# Patient Record
Sex: Female | Born: 1985 | Race: White | Hispanic: No | Marital: Married | Smoking: Former smoker
Health system: Southern US, Community
[De-identification: ages and names within clinical notes are randomized; demographics above are authoritative.]

## PROBLEM LIST (undated history)

## (undated) DIAGNOSIS — F1110 Opioid abuse, uncomplicated: Secondary | ICD-10-CM

## (undated) DIAGNOSIS — Z98891 History of uterine scar from previous surgery: Secondary | ICD-10-CM

## (undated) DIAGNOSIS — Z973 Presence of spectacles and contact lenses: Secondary | ICD-10-CM

## (undated) DIAGNOSIS — Z302 Encounter for sterilization: Secondary | ICD-10-CM

## (undated) DIAGNOSIS — Z9889 Other specified postprocedural states: Secondary | ICD-10-CM

## (undated) DIAGNOSIS — Z8632 Personal history of gestational diabetes: Secondary | ICD-10-CM

## (undated) DIAGNOSIS — R87629 Unspecified abnormal cytological findings in specimens from vagina: Secondary | ICD-10-CM

## (undated) DIAGNOSIS — F1121 Opioid dependence, in remission: Secondary | ICD-10-CM

## (undated) DIAGNOSIS — O24419 Gestational diabetes mellitus in pregnancy, unspecified control: Secondary | ICD-10-CM

## (undated) DIAGNOSIS — Z87410 Personal history of cervical dysplasia: Secondary | ICD-10-CM

## (undated) DIAGNOSIS — E559 Vitamin D deficiency, unspecified: Secondary | ICD-10-CM

## (undated) DIAGNOSIS — R112 Nausea with vomiting, unspecified: Secondary | ICD-10-CM

## (undated) DIAGNOSIS — Z8489 Family history of other specified conditions: Secondary | ICD-10-CM

## (undated) HISTORY — PX: LEEP: SHX91

## (undated) HISTORY — PX: CHOLECYSTECTOMY: SHX55

---
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
## 2003-02-14 ENCOUNTER — Other Ambulatory Visit: Admission: RE | Admit: 2003-02-14 | Discharge: 2003-02-14 | Payer: Self-pay | Admitting: Gynecology

## 2003-06-18 ENCOUNTER — Inpatient Hospital Stay (HOSPITAL_COMMUNITY): Admission: AD | Admit: 2003-06-18 | Discharge: 2003-06-18 | Payer: Self-pay | Admitting: Gynecology

## 2003-08-01 ENCOUNTER — Inpatient Hospital Stay (HOSPITAL_COMMUNITY): Admission: AD | Admit: 2003-08-01 | Discharge: 2003-08-01 | Payer: Self-pay | Admitting: *Deleted

## 2003-08-10 ENCOUNTER — Encounter: Admission: RE | Admit: 2003-08-10 | Discharge: 2003-08-10 | Payer: Self-pay | Admitting: Family Medicine

## 2003-08-17 ENCOUNTER — Ambulatory Visit (HOSPITAL_COMMUNITY): Admission: RE | Admit: 2003-08-17 | Discharge: 2003-08-17 | Payer: Self-pay | Admitting: Family Medicine

## 2005-03-04 ENCOUNTER — Other Ambulatory Visit: Admission: RE | Admit: 2005-03-04 | Discharge: 2005-03-04 | Payer: Self-pay | Admitting: Obstetrics and Gynecology

## 2005-06-25 ENCOUNTER — Other Ambulatory Visit: Admission: RE | Admit: 2005-06-25 | Discharge: 2005-06-25 | Payer: Self-pay | Admitting: Obstetrics and Gynecology

## 2005-07-15 ENCOUNTER — Other Ambulatory Visit: Admission: RE | Admit: 2005-07-15 | Discharge: 2005-07-15 | Payer: Self-pay | Admitting: Obstetrics and Gynecology

## 2005-09-24 ENCOUNTER — Other Ambulatory Visit: Admission: RE | Admit: 2005-09-24 | Discharge: 2005-09-24 | Payer: Self-pay | Admitting: Obstetrics & Gynecology

## 2006-02-18 ENCOUNTER — Other Ambulatory Visit: Admission: RE | Admit: 2006-02-18 | Discharge: 2006-02-18 | Payer: Self-pay | Admitting: Obstetrics & Gynecology

## 2006-09-24 ENCOUNTER — Inpatient Hospital Stay (HOSPITAL_COMMUNITY): Admission: RE | Admit: 2006-09-24 | Discharge: 2006-09-28 | Payer: Self-pay | Admitting: Obstetrics and Gynecology

## 2008-02-27 ENCOUNTER — Ambulatory Visit (HOSPITAL_COMMUNITY): Admission: RE | Admit: 2008-02-27 | Discharge: 2008-02-27 | Payer: Self-pay | Admitting: Family Medicine

## 2008-02-27 ENCOUNTER — Emergency Department (HOSPITAL_COMMUNITY): Admission: EM | Admit: 2008-02-27 | Discharge: 2008-02-28 | Payer: Self-pay | Admitting: Emergency Medicine

## 2008-02-28 ENCOUNTER — Encounter: Payer: Self-pay | Admitting: Gastroenterology

## 2008-03-02 DIAGNOSIS — R935 Abnormal findings on diagnostic imaging of other abdominal regions, including retroperitoneum: Secondary | ICD-10-CM | POA: Insufficient documentation

## 2008-03-02 DIAGNOSIS — R188 Other ascites: Secondary | ICD-10-CM | POA: Insufficient documentation

## 2008-03-02 DIAGNOSIS — R109 Unspecified abdominal pain: Secondary | ICD-10-CM | POA: Insufficient documentation

## 2008-03-07 ENCOUNTER — Ambulatory Visit: Payer: Self-pay | Admitting: Gastroenterology

## 2008-03-07 ENCOUNTER — Telehealth: Payer: Self-pay | Admitting: Gastroenterology

## 2008-03-07 DIAGNOSIS — K219 Gastro-esophageal reflux disease without esophagitis: Secondary | ICD-10-CM | POA: Insufficient documentation

## 2008-03-08 ENCOUNTER — Ambulatory Visit: Payer: Self-pay | Admitting: Gastroenterology

## 2008-12-13 ENCOUNTER — Emergency Department (HOSPITAL_COMMUNITY): Admission: EM | Admit: 2008-12-13 | Discharge: 2008-12-13 | Payer: Self-pay | Admitting: Emergency Medicine

## 2008-12-13 ENCOUNTER — Emergency Department (HOSPITAL_COMMUNITY): Admission: EM | Admit: 2008-12-13 | Discharge: 2008-12-13 | Payer: Self-pay | Admitting: Family Medicine

## 2009-09-13 ENCOUNTER — Encounter: Admission: RE | Admit: 2009-09-13 | Discharge: 2009-09-13 | Payer: Self-pay | Admitting: Internal Medicine

## 2010-03-12 ENCOUNTER — Emergency Department (HOSPITAL_COMMUNITY): Admission: EM | Admit: 2010-03-12 | Discharge: 2010-03-12 | Payer: Self-pay | Admitting: Family Medicine

## 2010-08-19 ENCOUNTER — Inpatient Hospital Stay (INDEPENDENT_AMBULATORY_CARE_PROVIDER_SITE_OTHER)
Admission: RE | Admit: 2010-08-19 | Discharge: 2010-08-19 | Disposition: A | Discharge status: Home / Self Care | Payer: Self-pay | Source: Ambulatory Visit | Attending: Family Medicine | Admitting: Family Medicine

## 2010-08-19 DIAGNOSIS — K5289 Other specified noninfective gastroenteritis and colitis: Secondary | ICD-10-CM

## 2010-08-19 LAB — POCT URINALYSIS DIP (DEVICE)
Bilirubin Urine: NEGATIVE
Glucose, UA: NEGATIVE mg/dL
Hgb urine dipstick: NEGATIVE
Ketones, ur: NEGATIVE mg/dL
Specific Gravity, Urine: 1.02 (ref 1.005–1.030)
pH: 5.5 (ref 5.0–8.0)

## 2010-10-11 NOTE — Op Note (Signed)
NAMEMARIPOSA, [name]              ACCOUNT NO.:  [id number]   MEDICAL RECORD NO.:  [id number]          PATIENT TYPE:  INP   LOCATION:  9129                          FACILITY:  [facility]   PHYSICIAN:  Sherron Monday, MD        DATE OF BIRTH:  11/24/1985   DATE OF PROCEDURE:  09/25/2006  DATE OF DISCHARGE:                               OPERATIVE REPORT   PREOPERATIVE DIAGNOSES:  1. Intrauterine pregnancy at term.  2. Failure to progress.  3. Arrest of dilatation.   POSTOPERATIVE DIAGNOSES:  1. Intrauterine pregnancy at term.  2. Failure to progress.  3. Arrest of dilatation.  4. Delivered via LTCS.   PROCEDURE:  Primary low transverse cesarean section.   ANESTHESIA:  Epidural.   SURGEON:  Sherron Monday, MD.   COMPLICATIONS:  None.   PATHOLOGY:  None.   FINDINGS:  Viable female infant at 6:37 a.m., Apgar of 9 at 1 minute and  9 at 5 minutes, weight 7 pounds 9 ounces. Placenta expressed in its  entirety. Normal uterus, tubes and ovaries.   ESTIMATED BLOOD LOSS:  500 mL.   IV FLUIDS:  2600 mL.   URINE OUTPUT:  400 mL clear.   DISPOSITION:  Stable to the PACU.   DESCRIPTION OF PROCEDURE:  After informed consent was reviewed with the  patient including the risks, benefits, and alternatives of the surgical  procedure, she was transported to the OR. Her epidural was dosed and  found to be adequate. She was then prepped and draped in the normal  sterile fashion. A Pfannenstiel skin incision was made approximately 2  fingerbreadths above the pubic symphysis and carried through the  underlying layer of fascia sharply, the fascia was incised in the  midline, the incision was extended laterally with Mayo scissors. The  inferior aspect of the fascial incision was grasped with Kocher clamps,  elevated and the rectus muscle dissected off bluntly and sharply.  Attention was then turned to the superior portion of the fascial  incision which was done in a similar fashion and was elevated  with  Kocher clamps. The rectus muscles were dissected off this bluntly and  sharply. The midline was easily identified, the peritoneum was entered  bluntly. The incision was extended superiorly and inferiorly with good  visualization of the bladder. The Alexis C section retractor was placed  and tracked to make sure that there was no bowel entrapped. The  vesicouterine peritoneum was identified, tented up with some pickups and  the bladder flap was created digitally and with Metzenbaum scissors.  A  transverse uterine incision was made and the infant was delivered from  the vertex presentation atraumatically. The nose and mouth were  suctioned and the fetal cord was clamped and cut. The infant was handed  off to the waiting pediatric staff. The placenta was expressed from the  uterus. The uterus was cleared of all clots and debris. The uterine  incision was closed with #0 Monocryl in a running locked fashion. A  second layer of this same suture was used as an imbricating stitch.  Hemostasis was assured,  copious irrigation was performed. The peritoneum  was reapproximated with several figure-of-eights of 2-0 Vicryl. The  fascia was closed with #0 Vicryl. The subcuticular adipose layer was  made hemostatic with bovie cautery. A 2-0 plain gut was used to close  the subcutaneous tissue. The skin was closed with staples. The patient  tolerated the procedure well. Sponge, lap and needle counts were correct  x2 at the end of the procedure.      Sherron Monday, MD  Electronically Signed     JB/MEDQ  D:  09/25/2006  T:  09/25/2006  Job:  [id number]

## 2010-10-11 NOTE — Group Therapy Note (Signed)
[name], [name]                          ACCOUNT NO.:  [id number]   MEDICAL RECORD NO.:  [id number]                   PATIENT TYPE:  OUT   LOCATION:  [facility]                           FACILITY:  [facility]   PHYSICIAN:  Tinnie Gens, MD                     DATE OF BIRTH:  10-Aug-1985   DATE OF SERVICE:  08/10/2003                                    CLINIC NOTE   CHIEF COMPLAINT:  Pelvic pain.   HISTORY OF PRESENT ILLNESS:  The patient is a 25 year old G0 who apparently  had an extensive workup by Dr. Nicholas Lose who had a ruptured cyst, but otherwise  a negative workup.  However, she did have abnormal Pap and history of LEEP  some time previously although she cannot remember exactly how long it is  been.  It has been a long time since she had a Pap smear for follow-up.  She  had also been on OCs which she can no longer afford.  She also lost her  insurance which is why she is here.  She was seen in the MAU on August 01, 2003 with increasing pelvic pain.  She reports that the pain is worse when  she is not bleeding but once her period starts it seems to get better.  She  did have regular cycles but for the last 2 months have been abnormal.   The patient reports the pain is in the left lower quadrant, noted it is  sharp in nature, is worse with movement.  It is also worse when she has gas  or is constipated.  She does report that Dr. Nicholas Lose told her this could be  IBS; however, she thinks she is fairly regular, hence that is likely not the  issue.  She denies any blood in her stool or blood in her urine.  She did  have the CT when she was seen in the MAU on August 01, 2003 that showed no  renal calculi and an essentially normal pelvis.  She had a pelvic ultrasound  in January 2005 that showed a 2 x 2 cm simple cyst on the left ovary with a  small amount of free fluid.   PAST MEDICAL HISTORY:  Significant for pyelonephritis in the past.   SURGICAL HISTORY:  Negative.   MEDICATIONS:   Anaprox.   ALLERGIES:  None known.   OBSTETRICAL HISTORY:  G0.   GYNECOLOGICAL HISTORY:  Menarche at age 48.  Regular cycles, last 3-4 days.  She is using condoms for contraception.  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  No STD history.   FAMILY HISTORY:  Diabetes in a grandmother.   SOCIAL HISTORY:  She is a smoker of one pack per day for the last 6 years.  She works as a Child psychotherapist.   REVIEW OF SYSTEMS:  A 14-point review of systems reviewed.  It is diffusely  positive, specifically for bruising, numbness and weakness in the legs,  muscle aches, fevers, night sweats, fatigue, dizzy spells, nausea, vomiting,  and trouble with urination at times; then the abdominal pain as described in  the HPI.   PHYSICAL EXAMINATION:  VITAL SIGNS:  Temperature 97.8, pulse 64, blood  pressure 105/52, weight 116.  GENERAL:  She is a well-developed well-nourished white female in no acute  distress.  ABDOMEN:  She does have some feeling of fullness in the left lower quadrant  that is tender with palpation.  She has negative CVA tenderness.  GENITOURINARY:  She has normal external female genitalia.  The cervix is  visualized and has an old scar from a LEEP.  The uterus is anteverted and  nontender.  The adnexa are without tenderness or mass.   IMPRESSION:  1. Abdominal pain, questionable etiology.  Have explained to the patient     that it is my goal to rule out pelvic pathology.  2. Abnormal Pap smear.  3. Smoker.  4. History of left ovarian cyst.   PLAN:  1. Pelvic ultrasound.  2. Pap smear today.  3. Continue with Anaprox.  4. Consider trial of OCs although the patient has been on these in the past     and they did not seem to help.  Will bring this up at her next visit.                                               Tinnie Gens, MD    TP/MEDQ  D:  08/10/2003  T:  08/11/2003  Job:  [id number]

## 2010-10-11 NOTE — Discharge Summary (Signed)
NAMEAUBURN, [name]              ACCOUNT NO.:  [id number]   MEDICAL RECORD NO.:  [id number]         PATIENT TYPE:  INP   LOCATION:  9129                          [facility]   PHYSICIAN:  Sherron Monday, MD        DATE OF BIRTH:  [birth year]   DATE OF ADMISSION:  09/24/2006  DATE OF DISCHARGE:  09/28/2006                               DISCHARGE SUMMARY   ADMISSION DIAGNOSIS:  Intrauterine pregnancy at term, for induction of  labor secondary to favorable cervix.   DISCHARGE DIAGNOSIS:  Intrauterine pregnancy at term, for induction of  labor secondary to favorable cervix.  Status post low transverse  cesarean section secondary to failure to progress.   HISTORY OF PRESENT ILLNESS:  A 25 year old G1 at 39-4/7 weeks for  induction of labor secondary to favorable cervix.  She states she has  had good fetal movement and also displayed no vaginal bleeding and  occasional contractions.  Her prenatal care has been uncomplicated.   PAST MEDICAL HISTORY:  Not significant.   PAST SURGICAL HISTORY:  Significant for a LEEP as well as wisdom teeth  being extracted.   PAST OBSTETRIC/GYNECOLOGICAL HISTORY:  G1 is the present pregnancy.  She  has a history of abnormal Pap smears with having a LEEP procedure and  LGSIL Pap in September 2007.  This will be repeated at her postpartum  checkup.   SEXUALLY TRANSMITTED DISEASES:  She has a history of human papilloma  virus.   MEDICATIONS:  Prenatal vitamins.   ALLERGIES:  No known drug allergies.   SOCIAL HISTORY:  Negative x3.  She is single.   FAMILY HISTORY:  Significant for diabetes in her grandparents.   PRENATAL LABORATORIES:  Hemoglobin 11.9, platelets 242,000, O positive,  antibody screen negative, gonorrhea negative, chlamydia negative, RPR  nonreactive, rubella immune, hepatitis B surface antigen negative,  cystic fibrosis carrier, HIV nonreactive, AFP within normal limits,  Glucola 93, first trimester screen within normal  limits.   PHYSICAL EXAMINATION:  VITAL SIGNS:  On admission, she is afebrile,  vital signs stable.  Benign exam.  VAGINAL:  2, 50 and -1.  Fetal heart tones were in the 140's and  reactive with regular contractions every 5 minutes.   HOSPITAL COURSE:  She was admitted for induction of labor.  Given  penicillin for positive group B strep.  We anticipate a vaginal  delivery.  Ultrasound performed on May 04, 2006, with an [facility] of Sep 25, 2006.  Normal anatomy, posterior placenta, female infant.  At  approximately 9:00 a.m. on May 1, her membranes were ruptured for clear  fluids without difficulty or complications.  At this time, she is 3-4 cm  dilated, 70% effaced and -1 station, fetal heart tones 140's and  reactive.  Her labor was augmented with Pitocin.  Her intrapartum course  was protracted.  However, the fetal heart rate remained reactive  throughout.  IUPC was placed.  On the morning of May 2 at 5:30 in the  morning, this patient stated she was comfortable with her epidural.  However, she was frustrated with her long labor.  Again, we discussed  the patient's protracted course of labor.  At this time, she was 6-7 cm  dilated, 90% effaced and 0 station.  I discussed with the patient the  protracted course of labor and the chance of continuing on and having a  vaginal delivery.  However, there was a chance of failure to progress,  leading to proceed with cesarean section.  Discussed with the patient  the risks, benefits and alternatives of a cesarean section.  At this  time, the patient requested low transverse cesarean section which was  performed at 6:00 a.m. on May 2 without complications.  Delivery of a  viable female infant at 6:37 a.m. with Apgar 9 at 1 minute and 9 at 5  minutes and a weight of 7 pounds 9 ounces.  EBL was approximately 500  cc.  She tolerated the procedure well.  Her postpartum course was  relatively uncomplicated.  She was discharged to home on  postoperative  day #3 without complaints except being sore, normal lochia and her pain  was well controlled.  Her staples were removed and Steri-Strips were  applied.  Incision was clean, dry and intact.  She will be discharged to  home with routine discharge instructions and numbers to call with any  questions or problems.  She is discharged home with Motrin, Vicodin,  iron and prenatal vitamins and an appointment to follow up in two weeks  for an incision check.   DISCHARGE INFORMATION:  She is O positive, plans to bottle feed.  We  will restart oral contraceptive pills at her 6-week checkup.  Her  postpartum hemoglobin was 8.8.  She is rubella immune.  During her C-  section, she had a temperature of 101 axillary.  She was given a 24-hour  course of gentamycin, ampicillin and clindamycin which she tolerated  well and she remained afebrile throughout her postnatal course.      Sherron Monday, MD  Electronically Signed     JB/MEDQ  D:  09/28/2006  T:  09/28/2006  Job:  [id number]

## 2010-10-19 ENCOUNTER — Emergency Department (HOSPITAL_COMMUNITY)
Admission: EM | Admit: 2010-10-19 | Discharge: 2010-10-19 | Disposition: A | Discharge status: Home / Self Care | Payer: Self-pay | Attending: Emergency Medicine | Admitting: Emergency Medicine

## 2010-10-19 DIAGNOSIS — H5789 Other specified disorders of eye and adnexa: Secondary | ICD-10-CM | POA: Insufficient documentation

## 2010-10-19 DIAGNOSIS — L2989 Other pruritus: Secondary | ICD-10-CM | POA: Insufficient documentation

## 2010-10-19 DIAGNOSIS — L298 Other pruritus: Secondary | ICD-10-CM | POA: Insufficient documentation

## 2010-10-19 DIAGNOSIS — H1010 Acute atopic conjunctivitis, unspecified eye: Secondary | ICD-10-CM | POA: Insufficient documentation

## 2010-12-07 ENCOUNTER — Inpatient Hospital Stay (INDEPENDENT_AMBULATORY_CARE_PROVIDER_SITE_OTHER)
Admission: RE | Admit: 2010-12-07 | Discharge: 2010-12-07 | Disposition: A | Discharge status: Home / Self Care | Payer: Self-pay | Source: Ambulatory Visit | Attending: Family Medicine | Admitting: Family Medicine

## 2010-12-07 DIAGNOSIS — M79609 Pain in unspecified limb: Secondary | ICD-10-CM

## 2010-12-07 LAB — PROTIME-INR: INR: 1.01 (ref 0.00–1.49)

## 2010-12-07 LAB — CBC
HCT: 37.9 % (ref 36.0–46.0)
Hemoglobin: 12.9 g/dL (ref 12.0–15.0)
MCH: 31 pg (ref 26.0–34.0)
MCV: 91.1 fL (ref 78.0–100.0)
RBC: 4.16 MIL/uL (ref 3.87–5.11)
RDW: 12.5 % (ref 11.5–15.5)
WBC: 7.3 10*3/uL (ref 4.0–10.5)

## 2010-12-07 LAB — COMPREHENSIVE METABOLIC PANEL
ALT: 7 U/L (ref 0–35)
BUN: 9 mg/dL (ref 6–23)
Calcium: 8.7 mg/dL (ref 8.4–10.5)
Chloride: 104 mEq/L (ref 96–112)
Creatinine, Ser: 0.47 mg/dL — ABNORMAL LOW (ref 0.50–1.10)
GFR calc Af Amer: >60 mL/min (ref >60)
GFR calc non Af Amer: >60 mL/min (ref >60)
Glucose, Bld: 107 mg/dL — ABNORMAL HIGH (ref 70–99)
Potassium: 4.5 mEq/L (ref 3.5–5.1)

## 2011-02-25 LAB — DIFFERENTIAL
Eosinophils Relative: 1
Neutro Abs: 9.8 — ABNORMAL HIGH
Neutrophils Relative %: 74

## 2011-02-25 LAB — COMPREHENSIVE METABOLIC PANEL
Alkaline Phosphatase: 62
BUN: 5 — ABNORMAL LOW
Calcium: 8.5
Chloride: 107
Creatinine, Ser: 0.51
Potassium: 3.5
Sodium: 136
Total Bilirubin: 1.1

## 2011-02-25 LAB — URINE CULTURE

## 2011-02-25 LAB — URINALYSIS, ROUTINE W REFLEX MICROSCOPIC
Bilirubin Urine: NEGATIVE
Glucose, UA: NEGATIVE
Protein, ur: NEGATIVE

## 2011-02-25 LAB — POCT PREGNANCY, URINE: Preg Test, Ur: NEGATIVE

## 2011-02-25 LAB — CBC
Hemoglobin: 12.9
RDW: 12.7
WBC: 13.2 — ABNORMAL HIGH

## 2011-03-30 ENCOUNTER — Emergency Department (INDEPENDENT_AMBULATORY_CARE_PROVIDER_SITE_OTHER): Payer: Self-pay

## 2011-03-30 ENCOUNTER — Emergency Department (INDEPENDENT_AMBULATORY_CARE_PROVIDER_SITE_OTHER)
Admission: EM | Admit: 2011-03-30 | Discharge: 2011-03-30 | Disposition: A | Discharge status: Home / Self Care | Payer: Self-pay | Source: Home / Self Care | Attending: Family Medicine | Admitting: Family Medicine

## 2011-03-30 DIAGNOSIS — M79609 Pain in unspecified limb: Secondary | ICD-10-CM

## 2011-03-30 DIAGNOSIS — M79673 Pain in unspecified foot: Secondary | ICD-10-CM

## 2011-03-30 MED ORDER — IBUPROFEN 800 MG PO TABS: 800.0000 mg | ORAL_TABLET | Freq: Three times a day (TID) | ORAL | Status: AC

## 2011-03-30 MED ORDER — TRAMADOL HCL 50 MG PO TABS: 50.0000 mg | ORAL_TABLET | Freq: Four times a day (QID) | ORAL | Status: AC | PRN

## 2011-03-30 NOTE — ED Provider Notes (Signed)
History     CSN: [id number] Arrival date & time: 03/30/2011  1:05 PM   First MD Initiated Contact with Patient 03/30/11 1351      Chief Complaint  Patient presents with  . 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 female presenting with foot injury. The history is provided by the patient.  Foot Injury  Incident onset: 4 days ago. The incident occurred at home. The injury mechanism was a direct blow. The pain is present in the left foot. The quality of the pain is described as sharp and aching. The pain is moderate. The pain has been intermittent since onset. Pertinent negatives include no numbness, no inability to bear weight, no loss of motion and no tingling. The symptoms are aggravated by bearing weight and palpation. She has tried NSAIDs and heat for the symptoms. The treatment provided no relief.    No past medical history on file.  Past Surgical History  Procedure Date  . Cesarean section   . Leep     No family history on file.  History  Substance Use Topics  . Smoking status: Not on file  . Smokeless tobacco: Not on file  . Alcohol Use:     OB History    Grav Para Term Preterm Abortions TAB SAB Ect Mult Living                  Review of Systems  Musculoskeletal: Negative for joint swelling.  Skin: Negative for color change and wound.  Neurological: Negative for tingling and numbness.    Allergies  Review of patient's allergies indicates no known allergies.  Home Medications   Current Outpatient Rx  Name Route Sig Dispense Refill  . ACETAMINOPHEN 325 MG PO TABS Oral Take 650 mg by mouth every 6 (six) hours as needed.      . IBUPROFEN 200 MG PO TABS Oral Take 200 mg by mouth every 6 (six) hours as needed.      Darlis Loan ESTRAD  TRIPHASIC 0.18/0.215/0.25 MG-25 MCG PO TABS Oral Take 1 tablet by mouth daily.        BP 114/72  Pulse 63  Temp 99 F (37.2 C)  Resp 16  LMP 03/29/2011  Physical Exam  Constitutional: She appears well-developed and well-nourished. No distress.  Cardiovascular: Normal rate, regular rhythm and normal heart sounds.   Pulmonary/Chest: Effort normal and breath sounds normal. No respiratory distress.  Musculoskeletal: Normal range of motion. She exhibits no edema.       Left foot: She exhibits tenderness, bony tenderness and deformity. She exhibits normal range of motion, no swelling, normal capillary refill and no laceration.       5th toe deviates medially at MTP joint causing prominence of MTP joint lateral foot. TTP at MTP joint w/o erythema or edema.  Neurological: She is alert.  Skin: Skin is warm and dry. No rash noted. No erythema.       Mild callous formation lateral 5th Lt MTP joint  Psychiatric: She has a normal mood and affect.    ED Course  Procedures (including critical care time)  Labs Reviewed - No data to display No results found.   No diagnosis found.    MDM  Xray read by radiologist, report reviewed.  Melody Comas, [place] 03/30/11 1558

## 2011-04-01 NOTE — ED Provider Notes (Signed)
Medical screening examination/treatment/procedure(s) were performed by non-physician practitioner and as supervising physician I was immediately available for consultation/collaboration.  Kimberly G Lykins, MD 04/01/11 1427 

## 2011-05-05 ENCOUNTER — Emergency Department (HOSPITAL_COMMUNITY): Payer: Self-pay

## 2011-05-05 ENCOUNTER — Emergency Department (HOSPITAL_COMMUNITY)
Admission: EM | Admit: 2011-05-05 | Discharge: 2011-05-05 | Disposition: A | Discharge status: Home / Self Care | Payer: Self-pay | Attending: Emergency Medicine | Admitting: Emergency Medicine

## 2011-05-05 ENCOUNTER — Encounter (HOSPITAL_COMMUNITY): Payer: Self-pay | Admitting: *Deleted

## 2011-05-05 DIAGNOSIS — M549 Dorsalgia, unspecified: Secondary | ICD-10-CM | POA: Insufficient documentation

## 2011-05-05 DIAGNOSIS — W19XXXA Unspecified fall, initial encounter: Secondary | ICD-10-CM

## 2011-05-05 DIAGNOSIS — W1809XA Striking against other object with subsequent fall, initial encounter: Secondary | ICD-10-CM | POA: Insufficient documentation

## 2011-05-05 DIAGNOSIS — M542 Cervicalgia: Secondary | ICD-10-CM | POA: Insufficient documentation

## 2011-05-05 MED ORDER — OXYCODONE-ACETAMINOPHEN 5-325 MG PO TABS
1.0000 | ORAL_TABLET | Freq: Once | ORAL | Status: AC
  Administered 2011-05-05: 1 via ORAL
  Filled 2011-05-05: qty 1

## 2011-05-05 MED ORDER — HYDROCODONE-ACETAMINOPHEN 5-325 MG PO TABS: 2.0000 | ORAL_TABLET | ORAL | Status: AC | PRN

## 2011-05-05 NOTE — ED Provider Notes (Signed)
History     CSN: [id number] Arrival date & time: 05/05/2011 12:58 PM   First MD Initiated Contact with Patient 05/05/11 1344      Chief Complaint  Patient presents with  . Neck Injury    pt reports falling last wed, and hurting neck. c/o pain to the "bone in my neck." pt reports taking home medications with no relief.     (Consider location/radiation/quality/duration/timing/severity/associated sxs/prior treatment) HPI Comments: Pt states she fell last wed from a latter and landed on her upper back. She c/o pain and not being able to sleep. Denies hitting head, LOC, change in vision, HA, N or vomiting. NO other complaints. Denies numbness or tingling of extremities.   Patient is a 25 y.o. female presenting with neck injury. The history is provided by the patient.  Neck Injury The current episode started in the past 7 days. The problem has been gradually worsening. Associated symptoms include neck pain. Pertinent negatives include no chest pain, headaches, nausea, visual change or vomiting.    History reviewed. No pertinent past medical history.  Past Surgical History  Procedure Date  . Cesarean section   . Leep     History reviewed. No pertinent family history.  History  Substance Use Topics  . Smoking status: Current Everyday Smoker -- 0.5 packs/day for 5 years    Types: Cigarettes  . Smokeless tobacco: Not on file  . Alcohol Use: No    OB History    Grav Para Term Preterm Abortions TAB SAB Ect Mult Living                  Review of Systems  HENT: Positive for neck pain. Negative for neck stiffness.   Cardiovascular: Negative for chest pain and palpitations.  Gastrointestinal: Negative for nausea and vomiting.  Musculoskeletal: Positive for back pain. Negative for gait problem.  Neurological: Negative for headaches.  All other systems reviewed and are negative.    Allergies  Review of patient's allergies indicates no known allergies.  Home Medications    Current Outpatient Rx  Name Route Sig Dispense Refill  . ACETAMINOPHEN 325 MG PO TABS Oral Take 650 mg by mouth every 6 (six) hours as needed. For neck pain    . HYDROCODONE-ACETAMINOPHEN 5-325 MG PO TABS Oral Take 1 tablet by mouth every 6 (six) hours as needed. For neck pain     . IBUPROFEN 200 MG PO TABS Oral Take 400 mg by mouth every 6 (six) hours as needed. For neck pain    . METHOCARBAMOL 500 MG PO TABS Oral Take 500 mg by mouth 4 (four) times daily as needed. For neck pain     . NORGESTIM-ETH ESTRAD TRIPHASIC 0.18/0.215/0.25 MG-25 MCG PO TABS Oral Take 1 tablet by mouth daily.       BP 117/72  Pulse 79  Temp(Src) 99 F (37.2 C) (Oral)  Resp 20  SpO2 100%  LMP 05/01/2011  Physical Exam  Nursing note and vitals reviewed. Constitutional: She is oriented to person, place, and time. She appears well-developed and well-nourished. No distress.  HENT:  Head: Normocephalic and atraumatic.  Eyes:       Normal appearance  Neck: Normal range of motion. Neck supple.  Pulmonary/Chest: Effort normal.  Musculoskeletal:       Cervical back: She exhibits tenderness and bony tenderness. She exhibits normal range of motion and no swelling.       Thoracic back: She exhibits tenderness and bony tenderness. She exhibits no swelling and  normal pulse.  Neurological: She is alert and oriented to person, place, and time. She has normal strength. No cranial nerve deficit or sensory deficit. She displays a negative Romberg sign. Coordination and gait normal. GCS eye subscore is 4. GCS verbal subscore is 5. GCS motor subscore is 6.  Psychiatric: She has a normal mood and affect. Her behavior is normal.    ED Course  Procedures (including critical care time)  Labs Reviewed - No data to display Dg Cervical Spine Complete  05/05/2011  *RADIOLOGY REPORT*  Clinical Data: Larey Seat.  Neck pain.  CERVICAL SPINE - COMPLETE 4+ VIEW  Comparison: None  Findings: The lateral film demonstrates normal alignment of  the cervical vertebral bodies.  Disc spaces and vertebral bodies are maintained.  No acute bony findings or abnormal prevertebral soft tissue swelling.  The oblique films demonstrate normally aligned articular facets and patent neural foramen.  The C1-C2 articulations are maintained. The lung apices are clear.  Small cervical ribs are noted.  IMPRESSION: Normal alignment and no acute bony findings.  Original Report Authenticated By: P. Loralie Champagne, M.D.   Dg Thoracic Spine 2 View  05/05/2011  *RADIOLOGY REPORT*  Clinical Data: 10 fell.  Back pain.  THORACIC SPINE - 2 VIEW  Comparison: None  Findings:  The lateral film demonstrates normal alignment of the thoracic vertebral bodies.  Disc spaces and vertebral bodies are maintained.  No acute bony findings, destructive bony changes or abnormal paraspinal soft tissue swelling.  The visualized posterior ribs appear normal.  IMPRESSION: Normal alignment and no acute bony findings.  Original Report Authenticated By: P. Loralie Champagne, M.D.     No diagnosis found.    MDM  Neck pain         [place], [place] 05/05/11 1515

## 2011-05-06 NOTE — ED Provider Notes (Signed)
Medical screening examination/treatment/procedure(s) were performed by non-physician practitioner and as supervising physician I was immediately available for consultation/collaboration.   Gerhard Munch, MD 05/06/11 [phone]

## 2011-08-19 ENCOUNTER — Telehealth: Payer: Self-pay

## 2011-11-11 ENCOUNTER — Ambulatory Visit (INDEPENDENT_AMBULATORY_CARE_PROVIDER_SITE_OTHER): Payer: Managed Care, Other (non HMO) | Admitting: Emergency Medicine

## 2011-11-11 VITALS — BP 115/77 | HR 75 | Temp 98.5°F | Resp 16 | Ht 60.5 in | Wt 121.0 lb

## 2011-11-11 DIAGNOSIS — S335XXA Sprain of ligaments of lumbar spine, initial encounter: Secondary | ICD-10-CM

## 2011-11-11 DIAGNOSIS — M545 Low back pain: Secondary | ICD-10-CM

## 2011-11-11 LAB — POCT URINALYSIS DIPSTICK
Bilirubin, UA: NEGATIVE
Blood, UA: NEGATIVE
Glucose, UA: NEGATIVE
Ketones, UA: NEGATIVE
Leukocytes, UA: NEGATIVE
Nitrite, UA: NEGATIVE
Protein, UA: NEGATIVE
Spec Grav, UA: 1.01
Urobilinogen, UA: 0.2
pH, UA: 7

## 2011-11-11 LAB — POCT UA - MICROSCOPIC ONLY
Casts, Ur, LPF, POC: NEGATIVE
Crystals, Ur, HPF, POC: NEGATIVE
Mucus, UA: NEGATIVE
Yeast, UA: NEGATIVE

## 2011-11-11 MED ORDER — CYCLOBENZAPRINE HCL 10 MG PO TABS: 10.0000 mg | ORAL_TABLET | Freq: Three times a day (TID) | ORAL | Status: AC | PRN

## 2011-11-11 MED ORDER — NAPROXEN SODIUM 550 MG PO TABS: 550.0000 mg | ORAL_TABLET | Freq: Two times a day (BID) | ORAL | Status: AC

## 2011-11-11 NOTE — Progress Notes (Signed)
  Subjective:    Patient ID: [name], female    DOB: 10-21-1985, 26 y.o.   MRN: [id number]  Back Pain This is a new problem. The current episode started in the past 7 days. The problem occurs constantly. The problem is unchanged. The pain is present in the lumbar spine. The quality of the pain is described as aching. The pain does not radiate. The pain is mild. The pain is worse during the day. The symptoms are aggravated by bending and twisting. Pertinent negatives include no abdominal pain, bladder incontinence, bowel incontinence, chest pain, dysuria, fever, headaches, leg pain, numbness, paresis, paresthesias, pelvic pain, perianal numbness, tingling, weakness or weight loss. She has tried heat for the symptoms.      Review of Systems  Constitutional: Negative.  Negative for fever and weight loss.  HENT: Negative.   Eyes: Negative.   Respiratory: Negative.   Cardiovascular: Negative for chest pain.  Gastrointestinal: Negative.  Negative for abdominal pain and bowel incontinence.  Genitourinary: Negative for bladder incontinence, dysuria and pelvic pain.  Musculoskeletal: Positive for back pain.  Neurological: Negative.  Negative for tingling, weakness, numbness, headaches and paresthesias.       Objective:   Physical Exam  Nursing note and vitals reviewed. Constitutional: She is oriented to person, place, and time. She appears well-developed and well-nourished.  HENT:  Head: Normocephalic and atraumatic.  Right Ear: External ear normal.  Left Ear: External ear normal.  Eyes: Conjunctivae and EOM are normal. Pupils are equal, round, and reactive to light. No scleral icterus.  Neck: Normal range of motion. Neck supple.  Cardiovascular: Normal rate, regular rhythm and normal heart sounds.   Pulmonary/Chest: Effort normal.  Abdominal: Soft. She exhibits no mass. There is no tenderness.  Musculoskeletal: Normal range of motion.       Lumbar back: She exhibits tenderness.     Back:  Neurological: She is alert and oriented to person, place, and time.  Skin: Skin is warm and dry.          Assessment & Plan:   Results for orders placed in visit on 11/11/11  POCT URINALYSIS DIPSTICK      Component Value Range   Color, UA yellow     Clarity, UA clear     Glucose, UA neg     Bilirubin, UA neg     Ketones, UA neg     Spec Grav, UA 1.010     Blood, UA neg     pH, UA 7.0     Protein, UA neg     Urobilinogen, UA 0.2     Nitrite, UA neg     Leukocytes, UA Negative    POCT UA - MICROSCOPIC ONLY      Component Value Range   WBC, Ur, HPF, POC 2-4     RBC, urine, microscopic 0-2     Bacteria, U Microscopic trace     Mucus, UA neg     Epithelial cells, urine per micros 1-3     Crystals, Ur, HPF, POC neg     Casts, Ur, LPF, POC neg     Yeast, UA neg     Low back pain to use local heat and meds as ordered.  Follow up for new or worsened symptoms

## 2012-01-08 ENCOUNTER — Ambulatory Visit: Payer: Self-pay | Admitting: Internal Medicine

## 2012-01-08 DIAGNOSIS — Z0289 Encounter for other administrative examinations: Secondary | ICD-10-CM

## 2012-02-09 ENCOUNTER — Ambulatory Visit (INDEPENDENT_AMBULATORY_CARE_PROVIDER_SITE_OTHER): Payer: Managed Care, Other (non HMO) | Admitting: Family Medicine

## 2012-02-09 VITALS — BP 106/64 | HR 80 | Temp 98.1°F | Resp 16 | Ht 61.0 in | Wt 120.0 lb

## 2012-02-09 DIAGNOSIS — H9209 Otalgia, unspecified ear: Secondary | ICD-10-CM

## 2012-02-09 DIAGNOSIS — J029 Acute pharyngitis, unspecified: Secondary | ICD-10-CM

## 2012-02-09 LAB — POCT RAPID STREP A (OFFICE): Rapid Strep A Screen: NEGATIVE

## 2012-02-09 MED ORDER — AMOXICILLIN 500 MG PO CAPS: 1000.0000 mg | ORAL_CAPSULE | Freq: Two times a day (BID) | ORAL | Status: DC

## 2012-02-09 NOTE — Progress Notes (Signed)
[facility] [address] [place] [address] [phone]  Date:  02/09/2012   Name:  [name]   DOB:  [birth year]   MRN:  [id number]  PCP:  No primary provider on file.    Chief Complaint: Otalgia and Sore Throat   History of Present Illness:  [name] is a 26 y.o. very pleasant female patient who presents with the following:  She notes tenderness in the gland on the right side of her neck, and right ear pain for the last 3 or 4 days.  She has a mild ST.  Worse symptoms in the morning. She does not feel congested.    She has not noted a fever, no cough, she has not noted white spots on her throat.    No GI symptoms  See problem list- she had an episode of pelvic ascites, but this turned out to be a likely ruptured ovarian cyst.  Her ascites resolved and has not come back.    No sick contacts.  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 GERD  . ABDOMINAL PAIN  . OTHER ASCITES  . ABDOMINAL XRAY, ABNORMAL    No past medical history on file.  Past Surgical History  Procedure Date  . Cesarean section   . Leep     History  Substance Use Topics  . Smoking status: Current Every Day Smoker -- 0.5 packs/day for 5 years    Types: Cigarettes  . Smokeless tobacco: Not on file  . Alcohol Use: No    No family history on file.  No Known Allergies  Medication list has been reviewed and updated.  Current Outpatient Prescriptions on File Prior to Visit  Medication Sig Dispense Refill  . Norgestimate-Ethinyl Estradiol Triphasic (ORTHO TRI-CYCLEN LO) 0.18/0.215/0.25 MG-25 MCG tablet Take 1 tablet by mouth daily.       Marland Kitchen acetaminophen (TYLENOL) 325 MG tablet Take 650 mg by mouth every 6 (six) hours as needed. For neck pain      . HYDROcodone-acetaminophen (NORCO) 5-325 MG per tablet Take 1 tablet by mouth every 6 (six) hours as needed. For neck pain       . ibuprofen  (ADVIL,MOTRIN) 200 MG tablet Take 400 mg by mouth every 6 (six) hours as needed. For neck pain      . methocarbamol (ROBAXIN) 500 MG tablet Take 500 mg by mouth 4 (four) times daily as needed. For neck pain       . naproxen sodium (ANAPROX DS) 550 MG tablet Take 1 tablet (550 mg total) by mouth 2 (two) times daily with a meal.  40 tablet  0    Review of Systems:  As per HPI- otherwise negative.   Physical Examination: Filed Vitals:   02/09/12 0916  BP: 106/64  Pulse: 80  Temp: 98.1 F (36.7 C)  Resp: 16   Filed Vitals:   02/09/12 0916  Height: 5\' 1"  (1.549 m)  Weight: 120 lb (54.432 kg)   Body mass index is 22.67 kg/(m^2). Ideal Body Weight: Weight in (lb) to have BMI = 25: 132   GEN: WDWN, NAD, Non-toxic, A & O x 3, looks well  HEENT: Atraumatic, Normocephalic. Neck supple. No masses, No LAD.  Both TM wnl, nasal cavity with stringy mucus.  Oropharynx: no exudate, slight redness right side.  Evidence of tobacco use Slight fullness and tenderness right anterior cervical nodes  Ears and Nose: No external deformity. CV: RRR, No M/G/R. No JVD. No thrill. No extra heart sounds. PULM: CTA B, no wheezes, crackles, rhonchi. No retractions. No resp. distress. No accessory muscle use. EXTR: No c/c/e NEURO Normal gait.  PSYCH: Normally interactive. Conversant. Not depressed or anxious appearing.  Calm demeanor.   Results for orders placed in visit on 02/09/12  POCT RAPID STREP A (OFFICE)      Component Value Range   Rapid Strep A Screen Negative  Negative    Assessment and Plan: 1. Sore throat  POCT rapid strep A, Culture, Group A Strep  2. Ear pain     Possible occult strep pharyngitis.   Did rx for amoxicillin, but she may wait until her culture comes back which is quite reasonable.  We will follow- up pending her culture, but let me know sooner if worse.    Abbe Amsterdam, MD

## 2012-02-11 ENCOUNTER — Telehealth: Payer: Self-pay

## 2012-02-11 LAB — CULTURE, GROUP A STREP

## 2012-02-11 NOTE — Telephone Encounter (Signed)
PT STATES THE DR SENT OFF FOR A STREP TEST AND SHE WOULD LIKE TO KNOW RESULTS. STATES HER THROAT IS STILL REALLY SORE AND IT NOW HAVE WHITE PATCHES IN THE BACK. PLEASE CALL [phone]

## 2012-02-11 NOTE — Telephone Encounter (Signed)
Called and LMOM- her throat culture is still pending, but is negative so far.  It will likely be negative, but if she is worried she can start the amoxicillin

## 2012-04-29 NOTE — Telephone Encounter (Signed)
No message

## 2012-11-23 ENCOUNTER — Other Ambulatory Visit: Payer: Self-pay | Admitting: Obstetrics and Gynecology

## 2012-11-23 DIAGNOSIS — N6310 Unspecified lump in the right breast, unspecified quadrant: Secondary | ICD-10-CM

## 2012-11-25 ENCOUNTER — Ambulatory Visit (INDEPENDENT_AMBULATORY_CARE_PROVIDER_SITE_OTHER): Payer: Managed Care, Other (non HMO) | Admitting: Emergency Medicine

## 2012-11-25 VITALS — BP 100/62 | HR 68 | Temp 98.0°F | Resp 18 | Ht 61.0 in | Wt 127.0 lb

## 2012-11-25 DIAGNOSIS — L0390 Cellulitis, unspecified: Secondary | ICD-10-CM

## 2012-11-25 MED ORDER — SULFAMETHOXAZOLE-TRIMETHOPRIM 800-160 MG PO TABS: 1.0000 | ORAL_TABLET | Freq: Two times a day (BID) | ORAL | Status: DC

## 2012-11-25 NOTE — Progress Notes (Signed)
[facility] [address] [place] [address] [phone]  Date:  11/25/2012   Name:  [name]   DOB:  [birth year]   MRN:  [id number]  PCP:  No PCP Per Patient    Chief Complaint: Insect Bite   History of Present Illness:  [name] is a 27 y.o. very pleasant female patient who presents with the following:  Noticed a tender erythematous pustule on her right forearm.  No fever or chills.  No nausea or vomiting.  No history of injury or bite.  Concerned about brown recluse spider.  No improvement with over the counter medications or other home remedies. Denies other complaint or health concern today.   Patient Active Problem List   Diagnosis Date Noted  . GERD 03/07/2008  . ABDOMINAL PAIN 03/02/2008  . ABDOMINAL XRAY, ABNORMAL 03/02/2008    History reviewed. No pertinent past medical history.  Past Surgical History  Procedure Laterality Date  . Cesarean section    . Leep    . Leep      History  Substance Use Topics  . Smoking status: Current Every Day Smoker -- 0.50 packs/day for 5 years    Types: Cigarettes  . Smokeless tobacco: Not on file  . Alcohol Use: No    History reviewed. No pertinent family history.  No Known Allergies  Medication list has been reviewed and updated.  Current Outpatient Prescriptions on File Prior to Visit  Medication Sig Dispense Refill  . acetaminophen (TYLENOL) 325 MG tablet Take 650 mg by mouth every 6 (six) hours as needed. For neck pain      . Norgestimate-Ethinyl Estradiol Triphasic (ORTHO TRI-CYCLEN LO) 0.18/0.215/0.25 MG-25 MCG tablet Take 1 tablet by mouth daily.        No current facility-administered medications on file prior to visit.    Review of Systems:  As per HPI, otherwise negative.    Physical Examination: Filed Vitals:   11/25/12 2054  BP: 100/62  Pulse: 68  Temp: 98 F (36.7 C)  Resp: 18   Filed Vitals:   11/25/12 2054  Height: 5\' 1"  (1.549 m)  Weight: 127 lb (57.607  kg)   Body mass index is 24.01 kg/(m^2). Ideal Body Weight: Weight in (lb) to have BMI = 25: 132   GEN: WDWN, NAD, Non-toxic, Alert & Oriented x 3 HEENT: Atraumatic, Normocephalic.  Ears and Nose: No external deformity. EXTR: No clubbing/cyanosis/edema NEURO: Normal gait.  PSYCH: Normally interactive. Conversant. Not depressed or anxious appearing.  Calm demeanor.  Skin:  Pustular lesion right forearm.  Minimal cellulitis  Assessment and Plan: Cellulitis Septra  Signed,  Phillips Odor, MD

## 2012-11-25 NOTE — Patient Instructions (Addendum)
Cellulitis Cellulitis is an infection of the skin and the tissue beneath it. The infected area is usually red and tender. Cellulitis occurs most often in the arms and lower legs.  CAUSES  Cellulitis is caused by bacteria that enter the skin through cracks or cuts in the skin. The most common types of bacteria that cause cellulitis are Staphylococcus and Streptococcus. SYMPTOMS   Redness and warmth.  Swelling.  Tenderness or pain.  Fever. DIAGNOSIS  Your caregiver can usually determine what is wrong based on a physical exam. Blood tests may also be done. TREATMENT  Treatment usually involves taking an antibiotic medicine. HOME CARE INSTRUCTIONS   Take your antibiotics as directed. Finish them even if you start to feel better.  Keep the infected arm or leg elevated to reduce swelling.  Apply a warm cloth to the affected area up to 4 times per day to relieve pain.  Only take over-the-counter or prescription medicines for pain, discomfort, or fever as directed by your caregiver.  Keep all follow-up appointments as directed by your caregiver. SEEK MEDICAL CARE IF:   You notice red streaks coming from the infected area.  Your red area gets larger or turns dark in color.  Your bone or joint underneath the infected area becomes painful after the skin has healed.  Your infection returns in the same area or another area.  You notice a swollen bump in the infected area.  You develop new symptoms. SEEK IMMEDIATE MEDICAL CARE IF:   You have a fever.  You feel very sleepy.  You develop vomiting or diarrhea.  You have a general ill feeling (malaise) with muscle aches and pains. MAKE SURE YOU:   Understand these instructions.  Will watch your condition.  Will get help right away if you are not doing well or get worse. Document Released: 02/19/2005 Document Revised: 11/11/2011 Document Reviewed: 07/28/2011 ExitCare Patient Information 2014 [employer].  

## 2012-11-30 ENCOUNTER — Other Ambulatory Visit: Payer: Self-pay | Admitting: Obstetrics and Gynecology

## 2012-11-30 ENCOUNTER — Ambulatory Visit
Admission: RE | Admit: 2012-11-30 | Discharge: 2012-11-30 | Disposition: A | Discharge status: Home / Self Care | Payer: Managed Care, Other (non HMO) | Source: Ambulatory Visit | Attending: Obstetrics and Gynecology | Admitting: Obstetrics and Gynecology

## 2012-11-30 DIAGNOSIS — N6310 Unspecified lump in the right breast, unspecified quadrant: Secondary | ICD-10-CM

## 2012-12-08 ENCOUNTER — Ambulatory Visit
Admission: RE | Admit: 2012-12-08 | Discharge: 2012-12-08 | Disposition: A | Discharge status: Home / Self Care | Payer: Managed Care, Other (non HMO) | Source: Ambulatory Visit | Attending: Obstetrics and Gynecology | Admitting: Obstetrics and Gynecology

## 2012-12-08 ENCOUNTER — Other Ambulatory Visit: Payer: Self-pay | Admitting: Obstetrics and Gynecology

## 2012-12-08 DIAGNOSIS — N6310 Unspecified lump in the right breast, unspecified quadrant: Secondary | ICD-10-CM

## 2013-08-23 ENCOUNTER — Ambulatory Visit (INDEPENDENT_AMBULATORY_CARE_PROVIDER_SITE_OTHER): Payer: BC Managed Care – PPO | Admitting: Family Medicine

## 2013-08-23 ENCOUNTER — Ambulatory Visit: Payer: BC Managed Care – PPO

## 2013-08-23 VITALS — BP 102/68 | HR 90 | Temp 98.0°F | Resp 16 | Ht 61.0 in | Wt 126.6 lb

## 2013-08-23 DIAGNOSIS — S0993XA Unspecified injury of face, initial encounter: Secondary | ICD-10-CM

## 2013-08-23 DIAGNOSIS — S0992XA Unspecified injury of nose, initial encounter: Secondary | ICD-10-CM

## 2013-08-23 DIAGNOSIS — S199XXA Unspecified injury of neck, initial encounter: Secondary | ICD-10-CM

## 2013-08-23 NOTE — Progress Notes (Signed)
   Subjective:    Patient ID: [name], female    DOB: 1985-06-09, 28 y.o.   MRN: [id number]  HPI Patient was fixing her daughter's hair last night when her daughter accidentally bumped the patient in the nose with her head. Patient experienced swelling, and pain. She did not loose consciousness or have visual changes. She used ice. Was afraid to take ibuprofen due to what she read on the internet. She slept on 4 pillows to reduce swelling and decrease pain. Patient is concerned that her nose is broken and doesn't want it to heal crooked.   Patient takes oral contraceptive pills. Has not missed any pills. LMP 08/16/13.  Review of Systems No nose bleeding or drainage. Mild headache between eyes.     Objective:   Physical Exam  Vitals reviewed. Constitutional: She appears well-developed and well-nourished.  HENT:  Head: Normocephalic and atraumatic.  Nose: Sinus tenderness present. No mucosal edema, rhinorrhea, nasal deformity or septal deviation. Right sinus exhibits no maxillary sinus tenderness and no frontal sinus tenderness. Left sinus exhibits no maxillary sinus tenderness and no frontal sinus tenderness.  Very slight swelling on left side of upper nose. Painful to palpation on left side of nose and around left medial orbit. No discoloration.  Skin: Skin is warm and dry.   Nasal XRay-  [facility] reading (PRIMARY) by  Dr. Patsy Lageropland- No evidence of fracture     Assessment & Plan:  1. Nose injury - DG Nasal Bone- negative for fracture -Continue ice pack for swelling through today -Ibuprofen 400-600 mg three times a day for swelling and pain -Discussed with Dr. Jackelyn Knifeopland   Jessica B. Gessner, FNP-BC  [facility]  08/23/2013 9:11 AM

## 2014-03-27 ENCOUNTER — Emergency Department (INDEPENDENT_AMBULATORY_CARE_PROVIDER_SITE_OTHER)
Admission: EM | Admit: 2014-03-27 | Discharge: 2014-03-27 | Disposition: A | Discharge status: Home / Self Care | Payer: Self-pay | Source: Home / Self Care | Attending: Emergency Medicine | Admitting: Emergency Medicine

## 2014-03-27 ENCOUNTER — Ambulatory Visit (HOSPITAL_COMMUNITY): Payer: Self-pay | Attending: Emergency Medicine

## 2014-03-27 ENCOUNTER — Encounter (HOSPITAL_COMMUNITY): Payer: Self-pay | Admitting: Emergency Medicine

## 2014-03-27 DIAGNOSIS — K8020 Calculus of gallbladder without cholecystitis without obstruction: Secondary | ICD-10-CM | POA: Insufficient documentation

## 2014-03-27 DIAGNOSIS — K8050 Calculus of bile duct without cholangitis or cholecystitis without obstruction: Secondary | ICD-10-CM

## 2014-03-27 DIAGNOSIS — R109 Unspecified abdominal pain: Secondary | ICD-10-CM

## 2014-03-27 LAB — POCT URINALYSIS DIP (DEVICE)
Bilirubin Urine: NEGATIVE
Glucose, UA: NEGATIVE mg/dL
KETONES UR: NEGATIVE mg/dL
LEUKOCYTES UA: NEGATIVE
Nitrite: NEGATIVE
PH: 7 (ref 5.0–8.0)
PROTEIN: NEGATIVE mg/dL
Specific Gravity, Urine: 1.01 (ref 1.005–1.030)
UROBILINOGEN UA: 0.2 mg/dL (ref 0.0–1.0)

## 2014-03-27 LAB — POCT I-STAT, CHEM 8
BUN: 6 mg/dL (ref 6–23)
CALCIUM ION: 1.17 mmol/L (ref 1.12–1.23)
Chloride: 105 mEq/L (ref 96–112)
Creatinine, Ser: 0.6 mg/dL (ref 0.50–1.10)
Glucose, Bld: 89 mg/dL (ref 70–99)
HEMATOCRIT: 43 % (ref 36.0–46.0)
Hemoglobin: 14.6 g/dL (ref 12.0–15.0)
Potassium: 4.1 mEq/L (ref 3.7–5.3)
Sodium: 140 mEq/L (ref 137–147)
TCO2: 25 mmol/L (ref 0–100)

## 2014-03-27 LAB — HEPATIC FUNCTION PANEL
ALT: 7 U/L (ref 0–35)
AST: 14 U/L (ref 0–37)
Albumin: 4 g/dL (ref 3.5–5.2)
Alkaline Phosphatase: 57 U/L (ref 39–117)
Total Bilirubin: 0.3 mg/dL (ref 0.3–1.2)
Total Protein: 7.3 g/dL (ref 6.0–8.3)

## 2014-03-27 LAB — CBC
HCT: 39.7 % (ref 36.0–46.0)
HEMOGLOBIN: 13.4 g/dL (ref 12.0–15.0)
MCH: 31.5 pg (ref 26.0–34.0)
MCHC: 33.8 g/dL (ref 30.0–36.0)
MCV: 93.4 fL (ref 78.0–100.0)
Platelets: 231 10*3/uL (ref 150–400)
RBC: 4.25 MIL/uL (ref 3.87–5.11)
RDW: 12.4 % (ref 11.5–15.5)
WBC: 6.9 10*3/uL (ref 4.0–10.5)

## 2014-03-27 LAB — LIPASE, BLOOD: LIPASE: 21 U/L (ref 11–59)

## 2014-03-27 LAB — POCT PREGNANCY, URINE: Preg Test, Ur: NEGATIVE

## 2014-03-27 NOTE — ED Notes (Signed)
Pt states she stopped drinking soda in May.  Since then she has gained 14 lbs and has developed a pain in her RUQ that radiates into her right side and back.  The pain has been getting progressively worse over the last two months.  States she went to the health department last month and had a negative pregnancy test.

## 2014-03-27 NOTE — Discharge Instructions (Signed)
Biliary Colic  °Biliary colic is a steady or irregular pain in the upper abdomen. It is usually under the right side of the rib cage. It happens when gallstones interfere with the normal flow of bile from the gallbladder. Bile is a liquid that helps to digest fats. Bile is made in the liver and stored in the gallbladder. When you eat a meal, bile passes from the gallbladder through the cystic duct and the common bile duct into the small intestine. There, it mixes with partially digested food. If a gallstone blocks either of these ducts, the normal flow of bile is blocked. The muscle cells in the bile duct contract forcefully to try to move the stone. This causes the pain of biliary colic.  °SYMPTOMS  °· A person with biliary colic usually complains of pain in the upper abdomen. This pain can be: °· In the center of the upper abdomen just below the breastbone. °· In the upper-right part of the abdomen, near the gallbladder and liver. °· Spread back toward the right shoulder blade. °· Nausea and vomiting. °· The pain usually occurs after eating. °· Biliary colic is usually triggered by the digestive system's demand for bile. The demand for bile is high after fatty meals. Symptoms can also occur when a person who has been fasting suddenly eats a very large meal. Most episodes of biliary colic pass after 1 to 5 hours. After the most intense pain passes, your abdomen may continue to ache mildly for about 24 hours. °DIAGNOSIS  °After you describe your symptoms, your caregiver will perform a physical exam. He or she will pay attention to the upper right portion of your belly (abdomen). This is the area of your liver and gallbladder. An ultrasound will help your caregiver look for gallstones. Specialized scans of the gallbladder may also be done. Blood tests may be done, especially if you have fever or if your pain persists. °PREVENTION  °Biliary colic can be prevented by controlling the risk factors for gallstones. Some of  these risk factors, such as heredity, increasing age, and pregnancy are a normal part of life. Obesity and a high-fat diet are risk factors you can change through a healthy lifestyle. Women going through menopause who take hormone replacement therapy (estrogen) are also more likely to develop biliary colic. °TREATMENT  °· Pain medication may be prescribed. °· You may be encouraged to eat a fat-free diet. °· If the first episode of biliary colic is severe, or episodes of colic keep retuning, surgery to remove the gallbladder (cholecystectomy) is usually recommended. This procedure can be done through small incisions using an instrument called a laparoscope. The procedure often requires a brief stay in the hospital. Some people can leave the hospital the same day. It is the most widely used treatment in people troubled by painful gallstones. It is effective and safe, with no complications in more than 90% of cases. °· If surgery cannot be done, medication that dissolves gallstones may be used. This medication is expensive and can take months or years to work. Only small stones will dissolve. °· Rarely, medication to dissolve gallstones is combined with a procedure called shock-wave lithotripsy. This procedure uses carefully aimed shock waves to break up gallstones. In many people treated with this procedure, gallstones form again within a few years. °PROGNOSIS  °If gallstones block your cystic duct or common bile duct, you are at risk for repeated episodes of biliary colic. There is also a 25% chance that you will develop   a gallbladder infection(acute cholecystitis), or some other complication of gallstones within 10 to 20 years. If you have surgery, schedule it at a time that is convenient for you and at a time when you are not sick. °HOME CARE INSTRUCTIONS  °· Drink plenty of clear fluids. °· Avoid fatty, greasy or fried foods, or any foods that make your pain worse. °· Take medications as directed. °SEEK MEDICAL  CARE IF:  °· You develop a fever over 100.5° F (38.1° C). °· Your pain gets worse over time. °· You develop nausea that prevents you from eating and drinking. °· You develop vomiting. °SEEK IMMEDIATE MEDICAL CARE IF:  °· You have continuous or severe belly (abdominal) pain which is not relieved with medications. °· You develop nausea and vomiting which is not relieved with medications. °· You have symptoms of biliary colic and you suddenly develop a fever and shaking chills. This may signal cholecystitis. Call your caregiver immediately. °· You develop a yellow color to your skin or the white part of your eyes (jaundice). °Document Released: 10/13/2005 Document Revised: 08/04/2011 Document Reviewed: 12/23/2007 °ExitCare® Patient Information ©2015 [employer]. This information is not intended to replace advice given to you by your health care provider. Make sure you discuss any questions you have with your health care provider. ° °Cholelithiasis °Cholelithiasis (also called gallstones) is a form of gallbladder disease in which gallstones form in your gallbladder. The gallbladder is an organ that stores bile made in the liver, which helps digest fats. Gallstones begin as small crystals and slowly grow into stones. Gallstone pain occurs when the gallbladder spasms and a gallstone is blocking the duct. Pain can also occur when a stone passes out of the duct.  °RISK FACTORS °· Being female.   °· Having multiple pregnancies. Health care providers sometimes advise removing diseased gallbladders before future pregnancies.   °· Being obese. °· Eating a diet heavy in fried foods and fat.   °· Being older than 60 years and increasing age.   °· Prolonged use of medicines containing female hormones.   °· Having diabetes mellitus.   °· Rapidly losing weight.   °· Having a family history of gallstones (heredity).   °SYMPTOMS °· Nausea.   °· Vomiting. °· Abdominal pain.   °· Yellowing of the skin (jaundice).   °· Sudden pain. It  may persist from several minutes to several hours. °· Fever.   °· Tenderness to the touch.  °In some cases, when gallstones do not move into the bile duct, people have no pain or symptoms. These are called "silent" gallstones.  °TREATMENT °Silent gallstones do not need treatment. In severe cases, emergency surgery may be required. Options for treatment include: °· Surgery to remove the gallbladder. This is the most common treatment. °· Medicines. These do not always work and may take 6-12 months or more to work. °· Shock wave treatment (extracorporeal biliary lithotripsy). In this treatment an ultrasound machine sends shock waves to the gallbladder to break gallstones into smaller pieces that can pass into the intestines or be dissolved by medicine. °HOME CARE INSTRUCTIONS  °· Only take over-the-counter or prescription medicines for pain, discomfort, or fever as directed by your health care provider.   °· Follow a low-fat diet until seen again by your health care provider. Fat causes the gallbladder to contract, which can result in pain.   °· Follow up with your health care provider as directed. Attacks are almost always recurrent and surgery is usually required for permanent treatment.   °SEEK IMMEDIATE MEDICAL CARE IF:  °· Your pain increases and is   not controlled by medicines.   °· You have a fever or persistent symptoms for more than 2-3 days.   °· You have a fever and your symptoms suddenly get worse.   °· You have persistent nausea and vomiting.   °MAKE SURE YOU:  °· Understand these instructions. °· Will watch your condition. °· Will get help right away if you are not doing well or get worse. °Document Released: 05/08/2005 Document Revised: 01/12/2013 Document Reviewed: 11/03/2012 °ExitCare® Patient Information ©2015 [employer]. This information is not intended to replace advice given to you by your health care provider. Make sure you discuss any questions you have with your health care provider. ° °

## 2014-03-27 NOTE — ED Provider Notes (Signed)
CSN: [id number]     Arrival date & time 03/27/14  0805 History   First MD Initiated Contact with Patient 03/27/14 [id number]     Chief Complaint  Patient presents with  . Abdominal Pain    right side   (Consider location/radiation/quality/duration/timing/severity/associated sxs/prior Treatment) HPI Comments: Works in Personnel officerfood service Smoker Social ETOH LNMP 2 mos ago PCP: none Symptoms improve when stomach is empty Is symptom free at time of [facility] visit today   Patient is a 28 y.o. female presenting with abdominal pain. The history is provided by the patient.  Abdominal Pain Pain location:  R flank and RUQ Pain quality: aching and burning   Pain severity:  Moderate Onset quality:  Gradual Duration: 2 months. Progression:  Waxing and waning Chronicity:  New Worsened by:  Eating Associated symptoms: no chest pain, no constipation, no cough, no diarrhea, no dysuria, no fever, no hematemesis, no hematochezia, no hematuria, no melena, no nausea, no shortness of breath, no vaginal bleeding, no vaginal discharge and no vomiting   Risk factors: has not had multiple surgeries   Risk factors comment:  Only surgical hx is that of C-sect in 2008   History reviewed. No pertinent past medical history. Past Surgical History  Procedure Laterality Date  . Cesarean section    . Leep    . Leep     History reviewed. No pertinent family history. History  Substance Use Topics  . Smoking status: Current Every Day Smoker -- 0.50 packs/day for 5 years    Types: Cigarettes  . Smokeless tobacco: Not on file  . Alcohol Use: No   OB History    No data available     Review of Systems  Constitutional: Negative for fever.  Respiratory: Negative for cough and shortness of breath.   Cardiovascular: Negative for chest pain.  Gastrointestinal: Positive for abdominal pain. Negative for nausea, vomiting, diarrhea, constipation, melena, hematochezia and hematemesis.  Genitourinary: Negative for dysuria,  hematuria, vaginal bleeding and vaginal discharge.  All other systems reviewed and are negative.   Allergies  Review of patient's allergies indicates no known allergies.  Home Medications   Prior to Admission medications   Medication Sig Start Date End Date Taking? Authorizing Provider  norethindrone-ethinyl estradiol (NECON,BREVICON,MODICON) 0.5-35 MG-MCG tablet Take 1 tablet by mouth daily.   Yes Historical Provider, MD   BP 121/81 mmHg  Pulse 77  Temp(Src) 97.9 F (36.6 C) (Oral)  Resp 16  SpO2 100%  LMP 03/27/2014 (Exact Date) Physical Exam  Constitutional: She is oriented to person, place, and time. She appears well-developed and well-nourished. No distress.  HENT:  Head: Normocephalic and atraumatic.  Eyes: Conjunctivae are normal. No scleral icterus.  Cardiovascular: Normal rate, regular rhythm and normal heart sounds.   Pulmonary/Chest: Effort normal and breath sounds normal. No respiratory distress. She has no wheezes.  Abdominal: Soft. Normal appearance. She exhibits no distension and no mass. Bowel sounds are decreased. There is no tenderness. There is no rigidity, no rebound, no guarding, no CVA tenderness and negative Murphy's sign.  Musculoskeletal: Normal range of motion.  Neurological: She is alert and oriented to person, place, and time.  Skin: Skin is warm and dry. No rash noted. No erythema.  Psychiatric: She has a normal mood and affect. Her behavior is normal.  Nursing note and vitals reviewed.   ED Course  Procedures (including critical care time) Labs Review Labs Reviewed  POCT URINALYSIS DIP (DEVICE) - Abnormal; Notable for the following:    Hgb  urine dipstick TRACE (*)    All other components within normal limits  CBC  LIPASE, BLOOD  HEPATIC FUNCTION PANEL  POCT PREGNANCY, URINE  POCT I-STAT, CHEM 8    Imaging Review [place] Abdomen Complete  03/27/2014   CLINICAL DATA:  Two-month history all abdominal pain; postprandial right upper quadrant and  right flank pain.  EXAM: ULTRASOUND ABDOMEN COMPLETE  COMPARISON:  CT scan of the abdomen and pelvis of September 13, 2009.  FINDINGS: Gallbladder: The gallbladder is adequately distended and contains a mobile shadowing stone measuring up to 18 mm. There is a small amount of sludge. There is no gallbladder wall thickening, pericholecystic fluid, or positive sonographic Murphy's sign.  Common bile duct: Diameter: 1.9 mm  Liver: No focal lesion identified. Within normal limits in parenchymal echogenicity.  IVC: No abnormality visualized.  Pancreas: Visualized portion unremarkable.  Spleen: Size and appearance within normal limits.  Right Kidney: Length: 10.9 cm. Echogenicity within normal limits. No mass or hydronephrosis visualized.  Left Kidney: Length: 11.2 cm. Echogenicity within normal limits. No mass or hydronephrosis visualized.  Abdominal aorta: No aneurysm visualized.  Other findings: There is no ascites.  IMPRESSION: Cholelithiasis without evidence of acute cholecystitis nor other acute hepatobiliary abnormality.   Electronically Signed   By: David  SwazilandJordan   On: 03/27/2014 10:10     MDM   1. Gall bladder stones   2. Abdominal pain   3. Biliary colic   No evidence of acute biliary obstruction or acute cholecystitis. Labs normal. No indication for urgent surgical intervention Patient advised that she may need to discuss possibility of elective cholecystectomy with [facility] if symptoms persist. Advised to stick to a bland, low fat diet. Tylenol or ibuprofen for pain Voices understanding if she develops persistent or severe pain, N/V, fever, bilious emesis she should report to nearest ER for assistance.     Ria ClockJennifer Lee H Estelle Greenleaf, [place] 03/27/14 1054

## 2014-05-26 HISTORY — PX: LAPAROSCOPIC CHOLECYSTECTOMY: SUR755

## 2014-07-10 ENCOUNTER — Other Ambulatory Visit (INDEPENDENT_AMBULATORY_CARE_PROVIDER_SITE_OTHER): Payer: Self-pay | Admitting: Surgery

## 2014-07-10 NOTE — H&P (Signed)
[name] 07/10/2014 1:43 PM Location: [facility] Patient #: [id number] DOB: April 22, 1986 Married / Language: [place] / Race: White Female History of Present Illness [name] MD; 07/10/2014 2:11 PM) Patient words: Evaluate gallbladder.  The patient is a 29 year old female who presents for evaluation of gall stones. Patient sent for surgical consultation by Ria Clock, PA, [facility] emergency [facility]. Pleasant young female. Half pack day smoker. Had episode of upper abdominal pain. RIGHT upper quadrant. Usually related to have severe meals. Had more severe attack that lasted several hours. Went to the emergency room in early November. Ultrasound suspicious for gallstones. Aggressive differential diagnoses less likely. Recommended a bland diet. Consider surgical evaluation if symptoms persist. Patient notes that she has had intermittent attacks still. She started a very bland diet and still struggles. Has mild heartburn controlled with Tums. Rather drinks any alcohol. No history of Crohn's. No colitis. No irritable bowel syndrome. Usually has bowel movements at least every other day. Occasionally irregular. Father had colon polyps. Paternal uncle colon cancer. No history of bleeding. Can walk a half hour without difficulty. Only surgery was C-section. She comes today with her young daughter, Ava. Because of the classic story and pain, emergency room recommended considering surgical evaluation. CLINICAL DATA: Two-month history all abdominal pain; postprandial right upper quadrant and right flank pain. EXAM: ULTRASOUND ABDOMEN COMPLETE COMPARISON: CT scan of the abdomen and pelvis of September 13, 2009. FINDINGS: Gallbladder: The gallbladder is adequately distended and contains a mobile shadowing stone measuring up to 18 mm. There is a small amount of sludge. There is no gallbladder wall thickening, pericholecystic fluid, or positive sonographic  Murphy's sign. Common bile duct: Diameter: 1.9 mm Liver: No focal lesion identified. Within normal limits in parenchymal echogenicity. IVC: No abnormality visualized. Pancreas: Visualized portion unremarkable. Spleen: Size and appearance within normal limits. Right Kidney: Length: 10.9 cm. Echogenicity within normal limits. No mass or hydronephrosis visualized. Left Kidney: Length: 11.2 cm. Echogenicity within normal limits. No mass or hydronephrosis visualized. Abdominal aorta: No aneurysm visualized. Other findings: There is no ascites. IMPRESSION: Cholelithiasis without evidence of acute cholecystitis nor other acute hepatobiliary abnormality. Electronically Signed By: David Swaziland On: 03/27/2014 10:10 Other Problems Maryan Puls, [place]; 07/10/2014 1:44 PM) Cholelithiasis  Past Surgical History Maryan Puls, [place]; 07/10/2014 1:44 PM) Cesarean Section - 1 Oral Surgery  Diagnostic Studies History Maryan Puls, [place]; 07/10/2014 1:44 PM) Colonoscopy 5-10 years ago Mammogram 1-3 years ago Pap Smear 1-5 years ago  Allergies Maryan Puls, [place]; 07/10/2014 1:44 PM) No Known Drug Allergies 07/10/2014  Medication History Maryan Puls, [place]; 07/10/2014 1:45 PM) Nortrel 0.5/35 (28) (0.5-35MG -MCG Tablet, Oral) Active. Ibuprofen (  Tablet, Oral) Active. (as needed) Medications Reconciled  Social History Neysa Bonito [place], [place]; 07/10/2014 1:44 PM) Alcohol use Occasional alcohol use. Caffeine use Coffee. No drug use Tobacco use Current some day smoker.  Family History Neysa Bonito [place], [place]; 07/10/2014 1:44 PM) Anesthetic complications Sister. Bleeding disorder Family Members In General. Colon Polyps Father.  Pregnancy / Birth History Maryan Puls, [place]; 07/10/2014 1:44 PM) Age at menarche 9 years. Contraceptive History Oral contraceptives. Gravida 1 Maternal age 47-25 Para 1 Regular periods     Review of Systems Neysa Bonito [place]; 07/10/2014 1:44  PM) General Present- Weight Gain. Not Present- Appetite Loss, Chills, Fatigue, Fever, Night Sweats and Weight Loss. Skin Present- Dryness. Not Present- Change in Wart/Mole, Hives, Jaundice, New Lesions, Non-Healing Wounds, Rash and Ulcer. HEENT Present- Wears glasses/contact lenses. Not Present- Earache, Hearing Loss, Hoarseness, Nose  Bleed, Oral Ulcers, Ringing in the Ears, Seasonal Allergies, Sinus Pain, Sore Throat, Visual Disturbances and Yellow Eyes. Respiratory Present- Snoring. Not Present- Bloody sputum, Chronic Cough, Difficulty Breathing and Wheezing. Breast Not Present- Breast Mass, Breast Pain, Nipple Discharge and Skin Changes. Cardiovascular Present- Leg Cramps. Not Present- Chest Pain, Difficulty Breathing Lying Down, Palpitations, Rapid Heart Rate, Shortness of Breath and Swelling of Extremities. Gastrointestinal Present- Abdominal Pain, Bloating, Change in Bowel Habits, Excessive gas, Gets full quickly at meals and Nausea. Not Present- Bloody Stool, Chronic diarrhea, Constipation, Difficulty Swallowing, Hemorrhoids, Indigestion, Rectal Pain and Vomiting. Female Genitourinary Present- Pelvic Pain. Not Present- Frequency, Nocturia, Painful Urination and Urgency. Musculoskeletal Present- Muscle Pain. Not Present- Back Pain, Joint Pain, Joint Stiffness, Muscle Weakness and Swelling of Extremities. Neurological Not Present- Decreased Memory, Fainting, Headaches, Numbness, Seizures, Tingling, Tremor, Trouble walking and Weakness. Psychiatric Not Present- Anxiety, Bipolar, Change in Sleep Pattern, Depression, Fearful and Frequent crying. Endocrine Present- Hair Changes. Not Present- Cold Intolerance, Excessive Hunger, Heat Intolerance, Hot flashes and New Diabetes. Hematology Not Present- Easy Bruising, Excessive bleeding, Gland problems, HIV and Persistent Infections.  Vitals Maryan Puls(Christy Moore MA; 07/10/2014 1:44 PM) 07/10/2014 1:44 PM Weight: 140 lb Height: 60in Body Surface Area: 1.64  m Body Mass Index: 27.34 kg/m Temp.: 97.34F(Temporal)  Pulse: 74 (Regular)  Resp.: 16 (Unlabored)  BP: 122/84 (Sitting, Left Arm, Standard)     Physical Exam [name](Abel Ra C. Brice Kossman MD; 07/10/2014 2:05 PM)  General Mental Status-Alert. General Appearance-Not in acute distress, Not Sickly. Orientation-Oriented X3. Hydration-Well hydrated. Voice-Normal.  Integumentary Global Assessment Upon inspection and palpation of skin surfaces of the - Axillae: non-tender, no inflammation or ulceration, no drainage. and Distribution of scalp and body hair is normal. General Characteristics Temperature - normal warmth is noted.  Head and Neck Head-normocephalic, atraumatic with no lesions or palpable masses. Face Global Assessment - atraumatic, no absence of expression. Neck Global Assessment - no abnormal movements, no bruit auscultated on the right, no bruit auscultated on the left, no decreased range of motion, non-tender. Trachea-midline. Thyroid Gland Characteristics - non-tender.  Eye Eyeball - Left-Extraocular movements intact, No Nystagmus. Eyeball - Right-Extraocular movements intact, No Nystagmus. Cornea - Left-No Hazy. Cornea - Right-No Hazy. Sclera/Conjunctiva - Left-No scleral icterus, No Discharge. Sclera/Conjunctiva - Right-No scleral icterus, No Discharge. Pupil - Left-Direct reaction to light normal. Pupil - Right-Direct reaction to light normal.  ENMT Ears Pinna - Left - no drainage observed, no generalized tenderness observed. Right - no drainage observed, no generalized tenderness observed. Nose and Sinuses External Inspection of the Nose - no destructive lesion observed. Inspection of the nares - Left - quiet respiration. Right - quiet respiration. Mouth and Throat Lips - Upper Lip - no fissures observed, no pallor noted. Lower Lip - no fissures observed, no pallor noted. Nasopharynx - no discharge present. Oral Cavity/Oropharynx  - Tongue - no dryness observed. Oral Mucosa - no cyanosis observed. Hypopharynx - no evidence of airway distress observed.  Chest and Lung Exam Inspection Movements - Normal and Symmetrical. Accessory muscles - No use of accessory muscles in breathing. Palpation Palpation of the chest reveals - Non-tender. Auscultation Breath sounds - Normal and Clear.  Cardiovascular Auscultation Rhythm - Regular. Murmurs & Other Heart Sounds - Auscultation of the heart reveals - No Murmurs and No Systolic Clicks.  Abdomen Inspection Inspection of the abdomen reveals - No Visible peristalsis and No Abnormal pulsations. Umbilicus - No Bleeding, No Urine drainage. Palpation/Percussion Palpation and Percussion of the abdomen reveal - Soft, Non Tender,  No Rebound tenderness, No Rigidity (guarding) and No Cutaneous hyperesthesia. Note: Soft. Minimal RIGHT upper quadrant/epigastric discomfort. No guarding. No rebound tenderness. No hernias.   Peripheral Vascular Upper Extremity Inspection - Left - No Cyanotic nailbeds, Not Ischemic. Right - No Cyanotic nailbeds, Not Ischemic.  Neurologic Neurologic evaluation reveals -normal attention span and ability to concentrate, able to name objects and repeat phrases. Appropriate fund of knowledge , normal sensation and normal coordination. Mental Status Affect - not angry, not paranoid. Cranial Nerves-Normal Bilaterally. Gait-Normal.  Neuropsychiatric Mental status exam performed with findings of-able to articulate well with normal speech/language, rate, volume and coherence, thought content normal with ability to perform basic computations and apply abstract reasoning and no evidence of hallucinations, delusions, obsessions or homicidal/suicidal ideation.  Musculoskeletal Global Assessment Spine, Ribs and Pelvis - no instability, subluxation or laxity. Right Upper Extremity - no instability, subluxation or laxity.  Lymphatic Head & Neck  General  Head & Neck Lymphatics: Bilateral - Description - No Localized lymphadenopathy. Axillary  General Axillary Region: Bilateral - Description - No Localized lymphadenopathy. Femoral & Inguinal  Generalized Femoral & Inguinal Lymphatics: Left - Description - No Localized lymphadenopathy. Right - Description - No Localized lymphadenopathy.    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 Differential diagnosis less likely. I think she would benefit from cholecystectomy. Reasonable sings site approach. She was open to go back to work full-time in 5 days. I cautioned her that that probably would be too soon. She is interested in proceeding.  I strongly recommend that she quit smoking. Noted that smokers have a higher risk of complications and longer recovery. She will consider this. Her very young daughter encouraged her to quit smoking as well.  Current Plans Schedule for Surgery Written instructions provided The anatomy & physiology of hepatobiliary & pancreatic function was discussed. The pathophysiology of gallbladder dysfunction was discussed. Natural history risks without surgery was discussed. I feel the risks of no intervention will lead to serious problems that outweigh the operative risks; therefore, I recommended cholecystectomy to remove the pathology. I explained laparoscopic techniques with possible need for an open approach. Probable cholangiogram to evaluate the bilary tract was explained as well.  Risks such as bleeding, infection, abscess, leak, injury to other organs, need for further treatment, heart attack, death, and other risks were discussed. I noted a good likelihood this will help address the problem. Possibility that this will not correct all abdominal symptoms was explained. Goals of post-operative recovery were  discussed as well. We will work to minimize complications. An educational handout further explaining the pathology and treatment options was given as well. Questions were answered. The patient expresses understanding & wishes to proceed with surgery. Pt Education - [facility] Laparosopic Post Op HCI (Maicol Bowland) Pt Education - [facility] Good Bowel Health (Burch [place]uk) Pt Education - [facility] Pain Control (Christophor Eick)   [name], M.D., F.A.C.S. Gastrointestinal and Minimally Invasive Surgery [facility] [address], [address] [place], [place] [address] [phone] Main / Paging

## 2014-08-10 ENCOUNTER — Other Ambulatory Visit: Payer: Self-pay | Admitting: General Surgery

## 2014-08-10 ENCOUNTER — Other Ambulatory Visit: Payer: Self-pay | Admitting: Surgery

## 2014-08-10 MED ORDER — ONDANSETRON HCL 4 MG PO TABS: 4.0000 mg | ORAL_TABLET | ORAL | Status: DC | PRN

## 2014-08-10 NOTE — Progress Notes (Signed)
She had a lap chole today by Dr. Michaell CowingGross.  She called and stated that the pain medication was causing nausea.  I e-prescribed Zofran for her and gave her some dietary instructions for tonight.

## 2014-09-19 ENCOUNTER — Encounter (HOSPITAL_COMMUNITY): Payer: Self-pay | Admitting: Emergency Medicine

## 2014-09-19 ENCOUNTER — Emergency Department (HOSPITAL_COMMUNITY)
Admission: EM | Admit: 2014-09-19 | Discharge: 2014-09-19 | Disposition: A | Discharge status: Home / Self Care | Payer: BLUE CROSS/BLUE SHIELD | Source: Home / Self Care | Attending: Family Medicine | Admitting: Family Medicine

## 2014-09-19 ENCOUNTER — Ambulatory Visit
Admission: RE | Admit: 2014-09-19 | Discharge: 2014-09-19 | Disposition: A | Discharge status: Home / Self Care | Payer: BLUE CROSS/BLUE SHIELD | Source: Ambulatory Visit | Attending: General Surgery | Admitting: General Surgery

## 2014-09-19 ENCOUNTER — Other Ambulatory Visit: Payer: Self-pay

## 2014-09-19 DIAGNOSIS — R109 Unspecified abdominal pain: Secondary | ICD-10-CM

## 2014-09-19 DIAGNOSIS — R1030 Lower abdominal pain, unspecified: Secondary | ICD-10-CM

## 2014-09-19 DIAGNOSIS — R1033 Periumbilical pain: Secondary | ICD-10-CM

## 2014-09-19 NOTE — ED Provider Notes (Signed)
CSN: [id number]     Arrival date & time 09/19/14  0806 History   First MD Initiated Contact with Patient 09/19/14 [id number]     No chief complaint on file.  (Consider location/radiation/quality/duration/timing/severity/associated sxs/prior Treatment) Patient is a 29 y.o. female presenting with abdominal pain. The history is provided by the patient.  Abdominal Pain Pain location:  Periumbilical Pain quality: sharp   Pain severity:  Mild Onset quality:  Gradual Duration:  1 week Timing:  Constant Progression:  Unchanged Chronicity:  New Context: previous surgery   Context comment:  Had lap chole 3/17 Associated symptoms: diarrhea and nausea   Associated symptoms: no chills, no fever and no vomiting   Associated symptoms comment:  Assoc right periumbilical sts.   No past medical history on file. Past Surgical History  Procedure Laterality Date  . Cesarean section    . Leep    . Leep     No family history on file. History  Substance Use Topics  . Smoking status: Current Every Day Smoker -- 0.50 packs/day for 5 years    Types: Cigarettes  . Smokeless tobacco: Not on file  . Alcohol Use: No   OB History    No data available     Review of Systems  Constitutional: Negative for fever and chills.  Gastrointestinal: Positive for nausea, abdominal pain and diarrhea. Negative for vomiting.    Allergies  Review of patient's allergies indicates no known allergies.  Home Medications   Prior to Admission medications   Medication Sig Start Date End Date Taking? Authorizing Provider  norethindrone-ethinyl estradiol (NECON,BREVICON,MODICON) 0.5-35 MG-MCG tablet Take 1 tablet by mouth daily.    Historical Provider, MD  ondansetron (ZOFRAN) 4 MG tablet Take 1 tablet (4 mg total) by mouth every 4 (four) hours as needed for nausea. 08/10/14   Avel Peaceodd Rosenbower, MD   BP 121/77 mmHg  Pulse 86  Temp(Src) 98.3 F (36.8 C) (Oral)  Resp 16  SpO2 100% Physical Exam  Constitutional: She is  oriented to person, place, and time. She appears well-developed and well-nourished. No distress.  Eyes: Pupils are equal, round, and reactive to light.  Neck: Normal range of motion. Neck supple.  Cardiovascular: Normal heart sounds.   Pulmonary/Chest: Breath sounds normal.  Abdominal: Soft. Bowel sounds are normal. She exhibits no distension and no mass. There is tenderness. There is no rebound and no guarding.  Neurological: She is alert and oriented to person, place, and time.  Skin: Skin is warm and dry.  Nursing note and vitals reviewed.   ED Course  Procedures (including critical care time) Labs Review Labs Reviewed - No data to display  Imaging Review No results found.   MDM   1. Periumbilical abdominal pain    Discussed with [facility] office, will see at 3PM today, labs to be done at [facility].     Linna HoffJames D Aydeen Blume, MD 09/19/14 [phone]

## 2014-09-19 NOTE — Discharge Instructions (Signed)
Go now to lab for labs requested by surgery office, see dr Daphine Deutschermartin in clinic at 3pm.

## 2014-09-19 NOTE — ED Notes (Signed)
Pt states she has been having stabbing pain around her umbilicus since she had laparoscopic surgery March 17.  She states she called her surgeon, but they did not call back.  She says the pain has moved to her right abdomen.  She states when she eats that part of her abdomen swells.  She was constipated and nauseous for a few days but now has 4 BM/s a day that are formed with diarrhea. .  She denies any fever or vomiting.

## 2015-07-03 ENCOUNTER — Other Ambulatory Visit: Payer: Self-pay | Admitting: Family Medicine

## 2015-07-03 DIAGNOSIS — R109 Unspecified abdominal pain: Secondary | ICD-10-CM

## 2015-07-11 ENCOUNTER — Ambulatory Visit
Admission: RE | Admit: 2015-07-11 | Discharge: 2015-07-11 | Disposition: A | Discharge status: Home / Self Care | Payer: BLUE CROSS/BLUE SHIELD | Source: Ambulatory Visit | Attending: Family Medicine | Admitting: Family Medicine

## 2015-07-11 DIAGNOSIS — R109 Unspecified abdominal pain: Secondary | ICD-10-CM

## 2016-03-17 LAB — OB RESULTS CONSOLE HEPATITIS B SURFACE ANTIGEN: Hepatitis B Surface Ag: NEGATIVE

## 2016-03-17 LAB — OB RESULTS CONSOLE ANTIBODY SCREEN: ANTIBODY SCREEN: NEGATIVE

## 2016-03-17 LAB — OB RESULTS CONSOLE RUBELLA ANTIBODY, IGM: Rubella: IMMUNE

## 2016-03-17 LAB — OB RESULTS CONSOLE ABO/RH: RH TYPE: POSITIVE

## 2016-03-17 LAB — OB RESULTS CONSOLE RPR: RPR: NONREACTIVE

## 2016-03-17 LAB — OB RESULTS CONSOLE GC/CHLAMYDIA
Chlamydia: NEGATIVE
Gonorrhea: NEGATIVE

## 2016-03-17 LAB — OB RESULTS CONSOLE HIV ANTIBODY (ROUTINE TESTING): HIV: NONREACTIVE

## 2016-08-11 ENCOUNTER — Encounter: Payer: BLUE CROSS/BLUE SHIELD | Attending: Obstetrics and Gynecology

## 2016-08-11 DIAGNOSIS — Z713 Dietary counseling and surveillance: Secondary | ICD-10-CM | POA: Insufficient documentation

## 2016-08-11 DIAGNOSIS — O99810 Abnormal glucose complicating pregnancy: Secondary | ICD-10-CM | POA: Diagnosis not present

## 2016-08-12 NOTE — Progress Notes (Signed)
  Patient was seen on 3/39/18 for Gestational Diabetes self-management class at the [facility]. The following learning objectives were met by the patient during this course:   States the definition of Gestational Diabetes  States why dietary management is important in controlling blood glucose  Describes the effects each nutrient has on blood glucose levels  Demonstrates ability to create a balanced meal plan  Demonstrates carbohydrate counting   States when to check blood glucose levels  Demonstrates proper blood glucose monitoring techniques  States the effect of stress and exercise on blood glucose levels  States the importance of limiting caffeine and abstaining from alcohol and smoking  Blood glucose monitor given: yes Lot # [id number]  Exp: 01/2017 Blood glucose reading: 78  Patient instructed to monitor glucose levels: FBS: 60 - <90 1 hour: <140 2 hour: <120  *Patient received handouts:  Nutrition Diabetes and Pregnancy  Carbohydrate Counting List  Patient will be seen for follow-up as needed.

## 2016-09-18 ENCOUNTER — Encounter (HOSPITAL_COMMUNITY): Payer: Self-pay | Admitting: *Deleted

## 2016-09-30 ENCOUNTER — Encounter (HOSPITAL_COMMUNITY): Payer: Self-pay | Admitting: *Deleted

## 2016-10-01 ENCOUNTER — Telehealth (HOSPITAL_COMMUNITY): Payer: Self-pay | Admitting: *Deleted

## 2016-10-01 NOTE — Telephone Encounter (Signed)
Preadmission screen  

## 2016-10-02 ENCOUNTER — Encounter (HOSPITAL_COMMUNITY): Payer: Self-pay

## 2016-10-07 ENCOUNTER — Encounter (HOSPITAL_COMMUNITY): Payer: Self-pay | Admitting: Obstetrics and Gynecology

## 2016-10-07 ENCOUNTER — Encounter (HOSPITAL_COMMUNITY)
Admission: RE | Admit: 2016-10-07 | Discharge: 2016-10-07 | Disposition: A | Discharge status: Home / Self Care | Payer: BLUE CROSS/BLUE SHIELD | Source: Ambulatory Visit | Attending: Obstetrics and Gynecology | Admitting: Obstetrics and Gynecology

## 2016-10-07 DIAGNOSIS — Z98891 History of uterine scar from previous surgery: Secondary | ICD-10-CM

## 2016-10-07 HISTORY — DX: History of uterine scar from previous surgery: Z98.891

## 2016-10-07 HISTORY — DX: Unspecified abnormal cytological findings in specimens from vagina: R87.629

## 2016-10-07 HISTORY — DX: Gestational diabetes mellitus in pregnancy, unspecified control: O24.419

## 2016-10-07 LAB — CBC
HEMATOCRIT: 35.1 % — AB (ref 36.0–46.0)
HEMOGLOBIN: 11.9 g/dL — AB (ref 12.0–15.0)
MCH: 32.2 pg (ref 26.0–34.0)
MCHC: 33.9 g/dL (ref 30.0–36.0)
MCV: 95.1 fL (ref 78.0–100.0)
Platelets: 208 10*3/uL (ref 150–400)
RBC: 3.69 MIL/uL — ABNORMAL LOW (ref 3.87–5.11)
RDW: 13.6 % (ref 11.5–15.5)
WBC: 12.5 10*3/uL — ABNORMAL HIGH (ref 4.0–10.5)

## 2016-10-07 LAB — TYPE AND SCREEN
ABO/RH(D): O POS
Antibody Screen: NEGATIVE

## 2016-10-07 LAB — ABO/RH: ABO/RH(D): O POS

## 2016-10-07 NOTE — H&P (Signed)
[name] is a 31 y.o. female G4P1021 at 85 wk for repeat LTCS given history of and declnes TOLAC.  D/W pt r/b/a of LTCS vs TOLAC, desires rLCTS.  Flu1/15/18, Tdap 08/04/16.  Nl first trimester screen. Mother is CF carrier, FOB is negative.  Pt with LEEP 2017. Pregnancy comlicated by GDM - diet controlled.     OB History    Gravida Para Term Preterm AB Living   4 1 1   2 1    SAB TAB Ectopic Multiple Live Births   2       1    G1 TSVD 2008, female 7#9 SAB x 2 G4 present +abn pap, LEEP. No STDs  Past Medical History:  Diagnosis Date  . Gestational diabetes   . History of cesarean section, low transverse 10/07/2016  . Vaginal Pap smear, abnormal    Past Surgical History:  Procedure Laterality Date  . CESAREAN SECTION    . CHOLECYSTECTOMY    . LEEP    . LEEP    . LEEP     Family History: DM Social History:  reports that she has been smoking Cigarettes.  She has a 2.50 pack-year smoking history. She has never used smokeless tobacco. She reports that she does not drink alcohol or use drugs. married, waitress Meds PNV All Amoxicillin and Bactrim      Maternal Diabetes: Yes:  Diabetes Type:  Diet controlled Genetic Screening: Normal Maternal Ultrasounds/Referrals: Normal Fetal Ultrasounds or other Referrals:  None Maternal Substance Abuse:  Yes:  Type: Smoker Significant Maternal Medications:  None Significant Maternal Lab Results:  Lab values include: Group B Strep positive Other Comments:  CF carrier, FOB CF neg  Review of Systems  Constitutional: Negative.   HENT: Negative.   Eyes: Negative.   Respiratory: Negative.   Cardiovascular: Negative.   Gastrointestinal: Negative.   Genitourinary: Negative.   Musculoskeletal: Positive for back pain.  Skin: Negative.   Neurological: Negative.   Psychiatric/Behavioral: Negative.    Maternal Medical History:  Contractions: Frequency: irregular.    Fetal activity: Perceived fetal activity is normal.    Prenatal  Complications - Diabetes: gestational. Diabetes is managed by diet.        Last menstrual period 01/09/2016. Maternal Exam:  Uterine Assessment: Contraction frequency is irregular.   Abdomen: Patient reports no abdominal tenderness. Surgical scars: low transverse.   Fundal height is appropriate for gestation.   Estimated fetal weight is 7.5-8.5#.   Fetal presentation: vertex  Introitus: Normal vulva. Normal vagina.    Physical Exam  Constitutional: She is oriented to person, place, and time. She appears well-developed and well-nourished.  HENT:  Head: Normocephalic and atraumatic.  Cardiovascular: Normal rate and regular rhythm.   Respiratory: Effort normal and breath sounds normal. No respiratory distress. She has no wheezes.  GI: Soft. Bowel sounds are normal. She exhibits no distension. There is no tenderness.  Musculoskeletal: Normal range of motion.  Neurological: She is alert and oriented to person, place, and time.  Skin: Skin is warm and dry.  Psychiatric: She has a normal mood and affect. Her behavior is normal.    Prenatal labs: ABO, Rh: --/--/O POS, O POS (05/15 1045) Antibody: NEG (05/15 1045) Rubella: Immune (10/23 0000) RPR: Nonreactive (10/23 0000)  HBsAg: Negative (10/23 0000)  HIV: Non-reactive (10/23 0000)  GBS:   positive  Hgb 12.3/Plt 250/Ur Cx neg/Varicella immune/glucola 162- failed 3 hr GTT/SMA neg/Fragile X neg  Nl NT, nl first trimester screen Nl anat, ant plac (female)  CF +, FOB CF neg Flu 1/15, Tdap 3/12   Assessment/Plan: 31yo [id number] at 39 for rLTCS D/w pt r/b/a - wish to proceed Ancef for prophylaxis   Burton Gahan Bovard-Stuckert 10/07/2016, 5:22 PM

## 2016-10-07 NOTE — Patient Instructions (Signed)
20 [name]  10/07/2016   Your procedure is scheduled on:  10/08/2016  Enter through the Main Entrance of [facility] at 0645 AM.  Pick up the phone at the desk and dial [phone].   Call this number if you have problems the morning of surgery: [phone]   Remember:   Do not eat food:After Midnight.  Do not drink clear liquids: After Midnight.  Take these medicines the morning of surgery with A SIP OF WATER: none   Do not wear jewelry, make-up or nail polish.  Do not wear lotions, powders, or perfumes. Do not wear deodorant.  Do not shave 48 hours prior to surgery.  Do not bring valuables to the hospital.  [facility] is not   responsible for any belongings or valuables brought to the hospital.  Contacts, dentures or bridgework may not be worn into surgery.  Leave suitcase in the car. After surgery it may be brought to your room.  For patients admitted to the hospital, checkout time is 11:00 AM the day of              discharge.   Patients discharged the day of surgery will not be allowed to drive             home.  Name and phone number of your driver: na  Special Instructions:   N/A   Please read over the following fact sheets that you were given:   Surgical Site Infection Prevention

## 2016-10-08 ENCOUNTER — Inpatient Hospital Stay (HOSPITAL_COMMUNITY): Payer: BLUE CROSS/BLUE SHIELD | Admitting: Certified Registered Nurse Anesthetist

## 2016-10-08 ENCOUNTER — Encounter (HOSPITAL_COMMUNITY)
Admission: RE | Disposition: A | Discharge status: Home / Self Care | Payer: Self-pay | Source: Ambulatory Visit | Attending: Obstetrics and Gynecology

## 2016-10-08 ENCOUNTER — Inpatient Hospital Stay (HOSPITAL_COMMUNITY)
Admission: RE | Admit: 2016-10-08 | Discharge: 2016-10-10 | DRG: 766 | Disposition: A | Discharge status: Home / Self Care | Payer: BLUE CROSS/BLUE SHIELD | Source: Ambulatory Visit | Attending: Obstetrics and Gynecology | Admitting: Obstetrics and Gynecology

## 2016-10-08 ENCOUNTER — Encounter (HOSPITAL_COMMUNITY): Payer: Self-pay

## 2016-10-08 DIAGNOSIS — O99824 Streptococcus B carrier state complicating childbirth: Secondary | ICD-10-CM | POA: Diagnosis present

## 2016-10-08 DIAGNOSIS — Z3A39 39 weeks gestation of pregnancy: Secondary | ICD-10-CM

## 2016-10-08 DIAGNOSIS — O34211 Maternal care for low transverse scar from previous cesarean delivery: Principal | ICD-10-CM | POA: Diagnosis present

## 2016-10-08 DIAGNOSIS — F17210 Nicotine dependence, cigarettes, uncomplicated: Secondary | ICD-10-CM | POA: Diagnosis present

## 2016-10-08 DIAGNOSIS — O99334 Smoking (tobacco) complicating childbirth: Secondary | ICD-10-CM | POA: Diagnosis present

## 2016-10-08 DIAGNOSIS — Z302 Encounter for sterilization: Secondary | ICD-10-CM

## 2016-10-08 DIAGNOSIS — O24420 Gestational diabetes mellitus in childbirth, diet controlled: Secondary | ICD-10-CM | POA: Diagnosis present

## 2016-10-08 DIAGNOSIS — Z98891 History of uterine scar from previous surgery: Secondary | ICD-10-CM

## 2016-10-08 HISTORY — DX: History of uterine scar from previous surgery: Z98.891

## 2016-10-08 HISTORY — DX: Encounter for sterilization: Z30.2

## 2016-10-08 LAB — RPR: RPR: NONREACTIVE

## 2016-10-08 LAB — GLUCOSE, CAPILLARY
Glucose-Capillary: 89 mg/dL (ref 65–99)
Glucose-Capillary: 84 mg/dL (ref 65–99)

## 2016-10-08 SURGERY — Surgical Case
Anesthesia: Spinal | Laterality: Bilateral

## 2016-10-08 MED ORDER — OXYCODONE HCL 5 MG PO TABS: 5.0000 mg | ORAL_TABLET | ORAL | Status: DC | PRN

## 2016-10-08 MED ORDER — PHENYLEPHRINE 40 MCG/ML (10ML) SYRINGE FOR IV PUSH (FOR BLOOD PRESSURE SUPPORT)
PREFILLED_SYRINGE | INTRAVENOUS | Status: AC
  Filled 2016-10-08: qty 10

## 2016-10-08 MED ORDER — SIMETHICONE 80 MG PO CHEW
80.0000 mg | CHEWABLE_TABLET | ORAL | Status: DC | PRN
  Filled 2016-10-08: qty 1

## 2016-10-08 MED ORDER — MORPHINE SULFATE (PF) 0.5 MG/ML IJ SOLN
INTRAMUSCULAR | Status: DC | PRN
  Administered 2016-10-08: .2 mg via INTRATHECAL

## 2016-10-08 MED ORDER — OXYCODONE HCL 5 MG/5ML PO SOLN: 5.0000 mg | Freq: Once | ORAL | Status: DC | PRN

## 2016-10-08 MED ORDER — DIPHENHYDRAMINE HCL 25 MG PO CAPS
25.0000 mg | ORAL_CAPSULE | Freq: Four times a day (QID) | ORAL | Status: DC | PRN
  Filled 2016-10-08: qty 1

## 2016-10-08 MED ORDER — PROMETHAZINE HCL 25 MG/ML IJ SOLN: 6.2500 mg | INTRAMUSCULAR | Status: DC | PRN

## 2016-10-08 MED ORDER — PHENYLEPHRINE 8 MG IN D5W 100 ML (0.08MG/ML) PREMIX OPTIME
INJECTION | INTRAVENOUS | Status: DC | PRN
  Administered 2016-10-08: 60 ug/min via INTRAVENOUS

## 2016-10-08 MED ORDER — IBUPROFEN 800 MG PO TABS
800.0000 mg | ORAL_TABLET | Freq: Three times a day (TID) | ORAL | Status: DC
  Administered 2016-10-08 – 2016-10-10 (×6): 800 mg via ORAL
  Filled 2016-10-08 (×6): qty 1

## 2016-10-08 MED ORDER — KETOROLAC TROMETHAMINE 30 MG/ML IJ SOLN
30.0000 mg | Freq: Four times a day (QID) | INTRAMUSCULAR | Status: AC | PRN
  Administered 2016-10-08: 30 mg via INTRAMUSCULAR

## 2016-10-08 MED ORDER — NALBUPHINE HCL 10 MG/ML IJ SOLN
INTRAMUSCULAR | Status: AC
  Filled 2016-10-08: qty 1

## 2016-10-08 MED ORDER — ONDANSETRON HCL 4 MG/2ML IJ SOLN: 4.0000 mg | Freq: Three times a day (TID) | INTRAMUSCULAR | Status: DC | PRN

## 2016-10-08 MED ORDER — NALBUPHINE HCL 10 MG/ML IJ SOLN: 5.0000 mg | INTRAMUSCULAR | Status: DC | PRN

## 2016-10-08 MED ORDER — HYDROMORPHONE HCL 1 MG/ML IJ SOLN
0.2500 mg | INTRAMUSCULAR | Status: DC | PRN
  Administered 2016-10-08: 0.5 mg via INTRAVENOUS

## 2016-10-08 MED ORDER — ACETAMINOPHEN 325 MG PO TABS
650.0000 mg | ORAL_TABLET | ORAL | Status: DC | PRN
  Administered 2016-10-08 – 2016-10-10 (×3): 650 mg via ORAL
  Filled 2016-10-08 (×3): qty 2

## 2016-10-08 MED ORDER — SODIUM CHLORIDE 0.9% FLUSH: 3.0000 mL | INTRAVENOUS | Status: DC | PRN

## 2016-10-08 MED ORDER — BUPIVACAINE HCL (PF) 0.75 % IJ SOLN
INTRAMUSCULAR | Status: DC | PRN
  Administered 2016-10-08: 1.6 mL via INTRATHECAL

## 2016-10-08 MED ORDER — NALOXONE HCL 2 MG/2ML IJ SOSY
1.0000 ug/kg/h | PREFILLED_SYRINGE | INTRAMUSCULAR | Status: DC | PRN
  Filled 2016-10-08: qty 2

## 2016-10-08 MED ORDER — ONDANSETRON HCL 4 MG/2ML IJ SOLN
INTRAMUSCULAR | Status: DC | PRN
  Administered 2016-10-08: 4 mg via INTRAVENOUS

## 2016-10-08 MED ORDER — DIBUCAINE 1 % RE OINT
1.0000 "application " | TOPICAL_OINTMENT | RECTAL | Status: DC | PRN
  Filled 2016-10-08: qty 28

## 2016-10-08 MED ORDER — KETOROLAC TROMETHAMINE 30 MG/ML IJ SOLN
INTRAMUSCULAR | Status: AC
  Filled 2016-10-08: qty 1

## 2016-10-08 MED ORDER — FENTANYL CITRATE (PF) 100 MCG/2ML IJ SOLN
INTRAMUSCULAR | Status: AC
  Filled 2016-10-08: qty 2

## 2016-10-08 MED ORDER — NALOXONE HCL 0.4 MG/ML IJ SOLN: 0.4000 mg | INTRAMUSCULAR | Status: DC | PRN

## 2016-10-08 MED ORDER — LACTATED RINGERS IV SOLN
INTRAVENOUS | Status: DC
  Administered 2016-10-08: 19:00:00 via INTRAVENOUS

## 2016-10-08 MED ORDER — NALBUPHINE HCL 10 MG/ML IJ SOLN
5.0000 mg | Freq: Once | INTRAMUSCULAR | Status: AC | PRN
  Administered 2016-10-08: 5 mg via SUBCUTANEOUS

## 2016-10-08 MED ORDER — COCONUT OIL OIL
1.0000 "application " | TOPICAL_OIL | Status: DC | PRN
  Filled 2016-10-08: qty 120

## 2016-10-08 MED ORDER — SIMETHICONE 80 MG PO CHEW
80.0000 mg | CHEWABLE_TABLET | ORAL | Status: DC
  Administered 2016-10-08 – 2016-10-09 (×2): 80 mg via ORAL
  Filled 2016-10-08 (×4): qty 1

## 2016-10-08 MED ORDER — MENTHOL 3 MG MT LOZG
1.0000 | LOZENGE | OROMUCOSAL | Status: DC | PRN
  Filled 2016-10-08: qty 9

## 2016-10-08 MED ORDER — DEXAMETHASONE SODIUM PHOSPHATE 10 MG/ML IJ SOLN
INTRAMUSCULAR | Status: AC
  Filled 2016-10-08: qty 1

## 2016-10-08 MED ORDER — WITCH HAZEL-GLYCERIN EX PADS: 1.0000 "application " | MEDICATED_PAD | CUTANEOUS | Status: DC | PRN

## 2016-10-08 MED ORDER — SCOPOLAMINE 1 MG/3DAYS TD PT72
1.0000 | MEDICATED_PATCH | Freq: Once | TRANSDERMAL | Status: DC
  Administered 2016-10-08: 1.5 mg via TRANSDERMAL
  Filled 2016-10-08: qty 1

## 2016-10-08 MED ORDER — GENTAMICIN SULFATE 40 MG/ML IJ SOLN
Freq: Once | INTRAVENOUS | Status: AC
  Administered 2016-10-08: 100 mL via INTRAVENOUS
  Filled 2016-10-08: qty 7.25

## 2016-10-08 MED ORDER — DEXAMETHASONE SODIUM PHOSPHATE 10 MG/ML IJ SOLN
INTRAMUSCULAR | Status: DC | PRN
  Administered 2016-10-08: 10 mg via INTRAVENOUS

## 2016-10-08 MED ORDER — HYDROMORPHONE HCL 1 MG/ML IJ SOLN
INTRAMUSCULAR | Status: AC
  Filled 2016-10-08: qty 1

## 2016-10-08 MED ORDER — DIPHENHYDRAMINE HCL 25 MG PO CAPS: 25.0000 mg | ORAL_CAPSULE | ORAL | Status: DC | PRN

## 2016-10-08 MED ORDER — BUPIVACAINE IN DEXTROSE 0.75-8.25 % IT SOLN
INTRATHECAL | Status: AC
  Filled 2016-10-08: qty 2

## 2016-10-08 MED ORDER — PHENYLEPHRINE HCL 10 MG/ML IJ SOLN
INTRAMUSCULAR | Status: DC | PRN
  Administered 2016-10-08 (×6): 40 ug via INTRAVENOUS

## 2016-10-08 MED ORDER — MORPHINE SULFATE (PF) 0.5 MG/ML IJ SOLN
INTRAMUSCULAR | Status: AC
  Filled 2016-10-08: qty 10

## 2016-10-08 MED ORDER — OXYCODONE HCL 5 MG PO TABS
10.0000 mg | ORAL_TABLET | ORAL | Status: DC | PRN
  Administered 2016-10-08 – 2016-10-10 (×7): 10 mg via ORAL
  Filled 2016-10-08 (×7): qty 2

## 2016-10-08 MED ORDER — DIPHENHYDRAMINE HCL 50 MG/ML IJ SOLN: 12.5000 mg | INTRAMUSCULAR | Status: DC | PRN

## 2016-10-08 MED ORDER — SIMETHICONE 80 MG PO CHEW
80.0000 mg | CHEWABLE_TABLET | Freq: Three times a day (TID) | ORAL | Status: DC
  Administered 2016-10-08 – 2016-10-10 (×5): 80 mg via ORAL
  Filled 2016-10-08 (×10): qty 1

## 2016-10-08 MED ORDER — OXYTOCIN 10 UNIT/ML IJ SOLN
INTRAVENOUS | Status: DC | PRN
  Administered 2016-10-08: 40 [IU] via INTRAVENOUS

## 2016-10-08 MED ORDER — KETOROLAC TROMETHAMINE 30 MG/ML IJ SOLN: 30.0000 mg | Freq: Four times a day (QID) | INTRAMUSCULAR | Status: AC | PRN

## 2016-10-08 MED ORDER — FENTANYL CITRATE (PF) 100 MCG/2ML IJ SOLN
INTRAMUSCULAR | Status: DC | PRN
  Administered 2016-10-08: 10 ug via INTRATHECAL

## 2016-10-08 MED ORDER — OXYCODONE HCL 5 MG PO TABS: 5.0000 mg | ORAL_TABLET | Freq: Once | ORAL | Status: DC | PRN

## 2016-10-08 MED ORDER — METOCLOPRAMIDE HCL 5 MG/ML IJ SOLN
INTRAMUSCULAR | Status: DC | PRN
  Administered 2016-10-08: 10 mg via INTRAVENOUS

## 2016-10-08 MED ORDER — NALBUPHINE HCL 10 MG/ML IJ SOLN: 5.0000 mg | Freq: Once | INTRAMUSCULAR | Status: AC | PRN

## 2016-10-08 MED ORDER — METOCLOPRAMIDE HCL 5 MG/ML IJ SOLN
INTRAMUSCULAR | Status: AC
  Filled 2016-10-08: qty 2

## 2016-10-08 MED ORDER — ZOLPIDEM TARTRATE 5 MG PO TABS: 5.0000 mg | ORAL_TABLET | Freq: Every evening | ORAL | Status: DC | PRN

## 2016-10-08 MED ORDER — LACTATED RINGERS IV SOLN
INTRAVENOUS | Status: DC | PRN
  Administered 2016-10-08: 09:00:00 via INTRAVENOUS

## 2016-10-08 MED ORDER — PHENYLEPHRINE 8 MG IN D5W 100 ML (0.08MG/ML) PREMIX OPTIME
INJECTION | INTRAVENOUS | Status: AC
  Filled 2016-10-08: qty 100

## 2016-10-08 MED ORDER — ONDANSETRON HCL 4 MG/2ML IJ SOLN
INTRAMUSCULAR | Status: AC
  Filled 2016-10-08: qty 2

## 2016-10-08 MED ORDER — MEPERIDINE HCL 25 MG/ML IJ SOLN: 6.2500 mg | INTRAMUSCULAR | Status: DC | PRN

## 2016-10-08 MED ORDER — PRENATAL MULTIVITAMIN CH
1.0000 | ORAL_TABLET | Freq: Every day | ORAL | Status: DC
  Administered 2016-10-09: 1 via ORAL
  Filled 2016-10-08 (×3): qty 1

## 2016-10-08 MED ORDER — OXYTOCIN 40 UNITS IN LACTATED RINGERS INFUSION - SIMPLE MED: 2.5000 [IU]/h | INTRAVENOUS | Status: AC

## 2016-10-08 MED ORDER — LACTATED RINGERS IV SOLN
INTRAVENOUS | Status: DC
Start: 2016-10-08 — End: 2016-10-08
  Administered 2016-10-08 (×3): via INTRAVENOUS

## 2016-10-08 MED ORDER — OXYTOCIN 10 UNIT/ML IJ SOLN
INTRAMUSCULAR | Status: AC
  Filled 2016-10-08: qty 4

## 2016-10-08 MED ORDER — SCOPOLAMINE 1 MG/3DAYS TD PT72
1.0000 | MEDICATED_PATCH | Freq: Once | TRANSDERMAL | Status: DC
  Filled 2016-10-08: qty 1

## 2016-10-08 MED ORDER — LACTATED RINGERS IV SOLN: INTRAVENOUS | Status: DC

## 2016-10-08 MED ORDER — SOD CITRATE-CITRIC ACID 500-334 MG/5ML PO SOLN
30.0000 mL | Freq: Once | ORAL | Status: AC
  Administered 2016-10-08: 30 mL via ORAL
  Filled 2016-10-08: qty 15

## 2016-10-08 MED ORDER — SENNOSIDES-DOCUSATE SODIUM 8.6-50 MG PO TABS
2.0000 | ORAL_TABLET | ORAL | Status: DC
  Administered 2016-10-08 – 2016-10-09 (×2): 2 via ORAL
  Filled 2016-10-08 (×4): qty 2

## 2016-10-08 SURGICAL SUPPLY — 42 items
BENZOIN TINCTURE PRP APPL 2/3 (GAUZE/BANDAGES/DRESSINGS) ×3 IMPLANT
CHLORAPREP W/TINT 26ML (MISCELLANEOUS) ×3 IMPLANT
CLAMP CORD UMBIL (MISCELLANEOUS) IMPLANT
CLOSURE STERI-STRIP 1/4X4 (GAUZE/BANDAGES/DRESSINGS) ×3 IMPLANT
CLOSURE WOUND 1/2 X4 (GAUZE/BANDAGES/DRESSINGS) ×1
CLOTH BEACON ORANGE TIMEOUT ST (SAFETY) ×3 IMPLANT
CONTAINER PREFILL 10% NBF 15ML (MISCELLANEOUS) IMPLANT
DRSG OPSITE POSTOP 4X10 (GAUZE/BANDAGES/DRESSINGS) ×3 IMPLANT
ELECT REM PT RETURN 9FT ADLT (ELECTROSURGICAL) ×3
ELECTRODE REM PT RTRN 9FT ADLT (ELECTROSURGICAL) ×1 IMPLANT
EXTRACTOR VACUUM M CUP 4 TUBE (SUCTIONS) IMPLANT
EXTRACTOR VACUUM M CUP 4' TUBE (SUCTIONS)
GLOVE BIO SURGEON STRL SZ 6.5 (GLOVE) ×2 IMPLANT
GLOVE BIO SURGEONS STRL SZ 6.5 (GLOVE) ×1
GLOVE BIOGEL PI IND STRL 7.0 (GLOVE) ×1 IMPLANT
GLOVE BIOGEL PI INDICATOR 7.0 (GLOVE) ×2
GOWN STRL REUS W/TWL LRG LVL3 (GOWN DISPOSABLE) ×6 IMPLANT
KIT ABG SYR 3ML LUER SLIP (SYRINGE) IMPLANT
NEEDLE HYPO 25X5/8 SAFETYGLIDE (NEEDLE) IMPLANT
NS IRRIG 1000ML POUR BTL (IV SOLUTION) ×3 IMPLANT
PACK C SECTION WH (CUSTOM PROCEDURE TRAY) ×3 IMPLANT
PAD OB MATERNITY 4.3X12.25 (PERSONAL CARE ITEMS) ×3 IMPLANT
PENCIL SMOKE EVAC W/HOLSTER (ELECTROSURGICAL) ×3 IMPLANT
RETRACTOR WND ALEXIS 25 LRG (MISCELLANEOUS) ×1 IMPLANT
RTRCTR C-SECT PINK 25CM LRG (MISCELLANEOUS) ×3 IMPLANT
RTRCTR WOUND ALEXIS 25CM LRG (MISCELLANEOUS) ×3
STRIP CLOSURE SKIN 1/2X4 (GAUZE/BANDAGES/DRESSINGS) ×2 IMPLANT
SUT MNCRL 0 VIOLET CTX 36 (SUTURE) ×2 IMPLANT
SUT MONOCRYL 0 CTX 36 (SUTURE) ×4
SUT PLAIN 1 NONE 54 (SUTURE) IMPLANT
SUT PLAIN 2 0 (SUTURE) ×2
SUT PLAIN 2 0 XLH (SUTURE) ×3 IMPLANT
SUT PLAIN ABS 2-0 CT1 27XMFL (SUTURE) ×1 IMPLANT
SUT VIC AB 0 CT1 27 (SUTURE) ×4
SUT VIC AB 0 CT1 27XBRD ANBCTR (SUTURE) ×2 IMPLANT
SUT VIC AB 0 CT1 36 (SUTURE) ×3 IMPLANT
SUT VIC AB 2-0 CT1 27 (SUTURE) ×2
SUT VIC AB 2-0 CT1 TAPERPNT 27 (SUTURE) ×1 IMPLANT
SUT VIC AB 4-0 KS 27 (SUTURE) ×3 IMPLANT
SYR BULB IRRIGATION 50ML (SYRINGE) ×3 IMPLANT
TOWEL OR 17X24 6PK STRL BLUE (TOWEL DISPOSABLE) ×3 IMPLANT
TRAY FOLEY BAG SILVER LF 14FR (SET/KITS/TRAYS/PACK) IMPLANT

## 2016-10-08 NOTE — Progress Notes (Signed)
Patient ID: [name], female   DOB: 1986/04/11, 31 y.o.   MRN: [id number] POD#0 Pt recovering well Pain controlled with meds Has no complaints Routine pp/post op care

## 2016-10-08 NOTE — Anesthesia Preprocedure Evaluation (Signed)
Anesthesia Evaluation  Patient identified by MRN, date of birth, ID band Patient awake    Reviewed: Allergy & Precautions, NPO status , Patient's Chart, lab work & pertinent test results  Airway Mallampati: II  TM Distance: >3 FB Neck ROM: Full    Dental no notable dental hx.    Pulmonary neg pulmonary ROS, Current Smoker,    Pulmonary exam normal breath sounds clear to auscultation       Cardiovascular negative cardio ROS Normal cardiovascular exam Rhythm:Regular Rate:Normal     Neuro/Psych negative neurological ROS  negative psych ROS   GI/Hepatic negative GI ROS, Neg liver ROS, GERD  ,  Endo/Other  negative endocrine ROSdiabetes, Gestational  Renal/GU negative Renal ROS  negative genitourinary   Musculoskeletal negative musculoskeletal ROS (+)   Abdominal   Peds negative pediatric ROS (+)  Hematology negative hematology ROS (+)   Anesthesia Other Findings   Reproductive/Obstetrics negative OB ROS (+) Pregnancy                             Anesthesia Physical Anesthesia Plan  ASA: II  Anesthesia Plan: Spinal   Post-op Pain Management:    Induction:   Airway Management Planned: Natural Airway  Additional Equipment:   Intra-op Plan:   Post-operative Plan:   Informed Consent: I have reviewed the patients History and Physical, chart, labs and discussed the procedure including the risks, benefits and alternatives for the proposed anesthesia with the patient or authorized representative who has indicated his/her understanding and acceptance.   Dental advisory given  Plan Discussed with: CRNA  Anesthesia Plan Comments:         Anesthesia Quick Evaluation

## 2016-10-08 NOTE — Transfer of Care (Signed)
Immediate Anesthesia Transfer of Care Note  Patient: Jessica BruinsAshlea Novak  Procedure(s) Performed: Procedure(s) with comments: REPEAT CESAREAN SECTION WITH BILATERAL TUBAL LIGATION (Bilateral) - Heather K to RNFA  Patient Location: PACU  Anesthesia Type:Spinal  Level of Consciousness: awake, alert  and oriented  Airway & Oxygen Therapy: Patient Spontanous Breathing  Post-op Assessment: Report given to RN and Post -op Vital signs reviewed and stable  Post vital signs: Reviewed and stable  Last Vitals:  Vitals:   10/08/16 0744  BP: 116/72  Pulse: 92  Resp: 18  Temp: 37 C    Last Pain:  Vitals:   10/08/16 0744  TempSrc: Oral         Complications: No apparent anesthesia complications

## 2016-10-08 NOTE — Anesthesia Procedure Notes (Signed)
Spinal  Patient location during procedure: OB Start time: 10/08/2016 8:42 AM End time: 10/08/2016 8:47 AM Staffing Anesthesiologist: Anitra LauthMILLER, Kyren Vaux RAY Performed: anesthesiologist  Preanesthetic Checklist Completed: patient identified, surgical consent, pre-op evaluation, timeout performed, IV checked, risks and benefits discussed and monitors and equipment checked Spinal Block Patient position: sitting Prep: Betadine and site prepped and draped Patient monitoring: heart rate, cardiac monitor, continuous pulse ox and blood pressure Approach: midline Location: L3-4 Injection technique: single-shot Needle Needle type: Pencan  Needle gauge: 24 G Needle length: 10 cm Assessment Sensory level: T4

## 2016-10-08 NOTE — Op Note (Signed)
NAMELUCAS, [name]              ACCOUNT NO.:  [id number]  MEDICAL RECORD NO.:  [id number]  LOCATION:  PERIO                         FACILITY:  [facility]  PHYSICIAN:  Sherron Monday, MD        DATE OF BIRTH:  Apr 15, 1986  DATE OF PROCEDURE:  10/08/2016 DATE OF DISCHARGE:                              OPERATIVE REPORT   PREOPERATIVE DIAGNOSIS:  Intrauterine pregnancy at 39+ weeks, history of low-transverse cesarean section, desires repeat, undesired fertility.  POSTOPERATIVE DIAGNOSIS:  Intrauterine pregnancy at 39+ weeks, history of low-transverse cesarean section, desires repeat, undesired fertility. Status post delivery and tubal ligation.  PROCEDURE:  Repeat low-transverse cesarean section with bilateral tubal ligation by bilateral salpingectomy.  SURGEON:  Sherron Monday, MD.  ASSISTANT:  Fleet Contras, RNFA.  FINDINGS:  Viable female infant at 9:03 a.m. with Apgars of 8 at 1 minute and 9 at 5 minutes and weight pending at the time of dictation.  Normal postpartum uterus, tubes, and ovaries.  ANESTHESIA:  Spinal.  IV FLUIDS:  3500 mL.  URINE OUTPUT:  450 mL clear urine at the end of the procedure.  ESTIMATED BLOOD LOSS:  Approximately 700 mL.  COMPLICATIONS:  None.  PATHOLOGY:  Placenta to L and D, bilateral tubal segments to pathology.  DESCRIPTION OF PROCEDURE:  After informed consent was reviewed with the patient and her partner, she was transported to the OR, placed on the table in supine position.  After her spinal had been placed and the level of her spinal was adequate, she was prepped and draped in the normal sterile fashion.  When she was laid back, she was laid back in a leftward tilt.  A Foley catheter was sterilely placed.  A time-out was performed.  A Pfannenstiel skin incision was made at the level of her previous incision, carried through to the underlying layer of fascia sharply.  The fascia was incised in the midline.  The incision was extended laterally  with Mayo scissors.  The superior aspect of the fascial incision was grasped with Kocher clamps, elevating the rectus muscles, were dissected off both bluntly and sharply.  Midline was easily identified and the peritoneum was entered bluntly.  The Alexis skin retractor was placed carefully making sure that no bowel was entrapped.  The uterus was incised in transverse fashion.  The infant was delivered from a vertex presentation.  Nose and mouth were suctioned on the field.  A minute was waited before the cord was cut.  Infant was handed to the waiting pediatric staff.  The placenta was expressed.  The uterus was cleared of all clot and debris.  The uterine incision was closed with 2 layers of 0 Monocryl, first of which was in a running locked and second as an imbricating layer.  This was found to be hemostatic.  The gutters were inspected and cleared of all clots.  The tubes were identified and followed out to the fimbriated end.  Using a long Kelly, the tubes were isolated, then excised and doubly ligated with plain gut suture.  This was performed bilaterally.  Hemostasis was assured.  The Alexis retractor was removed.  Peritoneum was reapproximated using 2-0 Vicryl.  The subfascial planes  were inspected and found to be hemostatic.  The fascia was closed with 2 sutures of 0 Vicryl overlapping in the midline.  The subcuticular adipose layer was made hemostatic with Bovie cautery.  The dead space was closed with several interrupted sutures of 3-0 plain gut.  The skin was reapproximated with 4-0 Vicryl on a Keith needle.  Benzoin and Steri- Strips were applied.  The patient tolerated the procedure well.  Sponge, lap, and needle counts were correct x2 per the operating room staff.     Sherron MondayJody Bovard, MD     JB/MEDQ  D:  10/08/2016  T:  10/08/2016  Job:  [id number]

## 2016-10-08 NOTE — Brief Op Note (Signed)
10/08/2016  9:57 AM  PATIENT:  [name]  31 y.o. female  PRE-OPERATIVE DIAGNOSIS:  repeat c/section;DESIRE STERILIZATION  POST-OPERATIVE DIAGNOSIS:  repeat cesarean section desires sterilization  PROCEDURE:  Procedure(s) with comments: REPEAT CESAREAN SECTION WITH BILATERAL TUBAL LIGATION (Bilateral) - Heather K to RNFA by salpingectomy  SURGEON:  Surgeon(s) and Role:    * Bovard-Stuckert, Kejon Feild, MD - Primary  ASSISTANTS: Webb SilversmithKrietemeyer, Heather RNFA   FINDINGS: viable female infant at 9:03, apgars 8/9; wt P at dictation, nl B tubes and ovaries and uterus.     ANESTHESIA:   spinal  EBL:  Total I/O In: 3500 [I.V.:3500] Out: 1150 [Urine:450; Blood:700]  BLOOD ADMINISTERED:none  DRAINS: Urinary Catheter (Foley)   LOCAL MEDICATIONS USED:  NONE  SPECIMEN:  Source of Specimen:  Placenta to L&D, B tubal segments to pathology  DISPOSITION OF SPECIMEN:  as listed  COUNTS:  YES  TOURNIQUET:  * No tourniquets in log *  DICTATION: .Other Dictation: Dictation Number [id number]  PLAN OF CARE: Admit to inpatient   PATIENT DISPOSITION:  PACU - hemodynamically stable.   Delay start of Pharmacological VTE agent (>24hrs) due to surgical blood loss or risk of bleeding: not applicable

## 2016-10-08 NOTE — Anesthesia Postprocedure Evaluation (Signed)
Anesthesia Post Note  Patient: [name]  Procedure(s) Performed: Procedure(s) (LRB): REPEAT CESAREAN SECTION WITH BILATERAL TUBAL LIGATION (Bilateral)  Patient location during evaluation: PACU Anesthesia Type: Spinal Level of consciousness: oriented and awake and alert Pain management: pain level controlled Vital Signs Assessment: post-procedure vital signs reviewed and stable Respiratory status: spontaneous breathing and respiratory function stable Cardiovascular status: blood pressure returned to baseline and stable Postop Assessment: no headache and no backache Anesthetic complications: no        Last Vitals:  Vitals:   10/08/16 1110 10/08/16 1124  BP:  (!) 104/47  Pulse: 65 65  Resp: 18 16  Temp:  36.7 C    Last Pain:  Vitals:   10/08/16 1124  TempSrc: Oral  PainSc:    Pain Goal:                 Lowella CurbWarren Ray Miller

## 2016-10-08 NOTE — Addendum Note (Signed)
Addendum  created 10/08/16 1505 by Elgie CongoMalinova, Edon Hoadley H, CRNA   Sign clinical note

## 2016-10-08 NOTE — Lactation Note (Signed)
This note was copied from a baby's chart. Lactation Consultation Note  Patient Name: [name] Reason for consult: Initial assessment Baby at 1 hr of life. Upon entry baby was latched in a face down position on mom's chest. Moved baby to a more side line position on the L breast with support pillows. Positioning is hard for mom at this visit because she is supine. Mom did not bf her 1st child. She is not sure she wants to bf this baby. She was reporting pinching with latch, showed Dad how to pull the baby's jaw down to get a deeper latch. Mom appears to have small breast with short shaft nipples. Limited bf education was done and the family needs the lactation hand outs. Discussed baby behavior, feeding frequency, and nipple care.    Maternal Data Has patient been taught Hand Expression?: No Does the patient have breastfeeding experience prior to this delivery?: No  Feeding Feeding Type: Breast Fed Length of feed: 25 min  LATCH Score/Interventions Latch: Grasps breast easily, tongue down, lips flanged, rhythmical sucking.  Audible Swallowing: A few with stimulation Intervention(s): Skin to skin  Type of Nipple: Everted at rest and after stimulation  Comfort (Breast/Nipple): Soft / non-tender     Hold (Positioning): Full assist, staff holds infant at breast Intervention(s): Position options;Support Pillows  LATCH Score: 7  Lactation Tools Discussed/Used     Consult Status Consult Status: Follow-up Date: 10/09/16 Follow-up type: In-patient    Rulon Eisenmengerlizabeth E Carey Lafon Novak, 10:49 AM

## 2016-10-08 NOTE — Consult Note (Signed)
[facility]  Delivery Note:  C-section       10/08/2016  9:09 AM  I was called to the operating room at the request of the patient's obstetrician (Dr. Bovard-Stuckert) for a repeat c-section.  PRENATAL HX:  This is a 30 y/o G4P1021 at 39 and 0/[redacted] weeks gestation who was admitted for a repeat c-section.  Her pregnancy has been complicated by GDM that is diet controlled.  She is also a CF carrier, though father has been tested and he does not carry the gene.    INTRAPARTUM HX:   Repeat c-section with AROM at delivery  DELIVERY:  Infant was vigorous at delivery, requiring no resuscitation other than standard warming, drying and stimulation.  APGARs 8 and 9.  Exam within normal limits.  After 5 minutes, baby left with nurse to assist parents with skin-to-skin care.   _____________________ Electronically Signed By: Quaid Yeakle, MD Neonatologist  

## 2016-10-08 NOTE — Interval H&P Note (Signed)
History and Physical Interval Note:  10/08/2016 8:17 AM  [name]  has presented today for surgery, with the diagnosis of repeat c/section;DESIRE STERILIZATION  The various methods of treatment have been discussed with the patient and family. After consideration of risks, benefits and other options for treatment, the patient has consented to  Procedure(s) with comments: REPEAT CESAREAN SECTION WITH BILATERAL TUBAL LIGATION (Bilateral) - Heather K to RNFA as a surgical intervention .  The patient's history has been reviewed, patient examined, no change in status, stable for surgery.  I have reviewed the patient's chart and labs.  Questions were answered to the patient's satisfaction.     Wong Steadham Bovard-Stuckert

## 2016-10-08 NOTE — Anesthesia Postprocedure Evaluation (Signed)
Anesthesia Post Note  Patient: Kristin BruinsAshlea Samaras  Procedure(s) Performed: Procedure(s) (LRB): REPEAT CESAREAN SECTION WITH BILATERAL TUBAL LIGATION (Bilateral)  Patient location during evaluation: Mother Baby Anesthesia Type: Spinal Level of consciousness: awake and alert and oriented Pain management: pain level controlled Vital Signs Assessment: post-procedure vital signs reviewed and stable Respiratory status: spontaneous breathing and nonlabored ventilation Cardiovascular status: stable Postop Assessment: no headache, no backache, spinal receding, patient able to bend at knees, no signs of nausea or vomiting and adequate PO intake Anesthetic complications: no        Last Vitals:  Vitals:   10/08/16 1300 10/08/16 1404  BP: (!) 88/42 (!) 93/44  Pulse: (!) 58 63  Resp: 16 18  Temp: 36.8 C     Last Pain:  Vitals:   10/08/16 1400  TempSrc:   PainSc: 2    Pain Goal:                 Land O'LakesMalinova,Elaine Middleton Hristova

## 2016-10-09 LAB — CBC
HCT: 29.3 % — ABNORMAL LOW (ref 36.0–46.0)
HEMOGLOBIN: 10 g/dL — AB (ref 12.0–15.0)
MCH: 32.6 pg (ref 26.0–34.0)
MCHC: 34.1 g/dL (ref 30.0–36.0)
MCV: 95.4 fL (ref 78.0–100.0)
PLATELETS: 189 10*3/uL (ref 150–400)
RBC: 3.07 MIL/uL — AB (ref 3.87–5.11)
RDW: 13.7 % (ref 11.5–15.5)
WBC: 18.2 10*3/uL — ABNORMAL HIGH (ref 4.0–10.5)

## 2016-10-09 LAB — BIRTH TISSUE RECOVERY COLLECTION (PLACENTA DONATION)

## 2016-10-09 NOTE — Lactation Note (Signed)
This note was copied from a baby's chart. Lactation Consultation Note Mom having severe pain w/latching to nipples. Lt. Nipple w/scabbed area. 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 Breast, Lt. Has more, but still small. 4-5 fingers width between breast. More tissue to outer areas. Hand expression w/dot of colostrum. Baby had adequate output for hours of age. Baby is 20 hrs old.  Mom stated she didn't have any change during pregnancy in her breast. Has had leaking colostrum.  Assisted in latching w/#20 NS d/t severe painful latches. In football hold latched baby. Baby kept fish lips, adjusted frequently to widen flange, baby kept clamping. Mom stated no it hurts. Noted pinched nipple. Fitted#16 NS, fit well, baby had wide flange, LC and mom compressing tissue to firm breast, baby suckling great. Mom stated much better then hurts bad. Readjusted multiple times.when removed nothing in NS, noted nipple smaller than it was before latching.' Mom stated she had planned on breast and bottle anyway, so she will start that now instead of later.  Mom shown how to use DEBP & how to disassemble, clean, & reassemble parts. Mom knows to pump q3h for 15-20 min. Mom encouraged to feed baby 8-12 times/24 hours and with feeding cues.  Comfort gels given. Feeding amount sheet reviewed for hours of age how much formula to give. Explained to mom she may not pump anything out now, mainly fir stimulation.  Mom states she understands pumping and bottle feeding. Reported to RN. Patient Name: [name] FYBOF'BToday's Date: 10/09/2016 Reason for consult: Follow-up assessment;Breast/nipple pain;Difficult latch   Maternal Data    Feeding Feeding Type: Formula Nipple Type: Slow - flow Length of feed: 4 min  LATCH Score/Interventions Latch: Repeated attempts needed to sustain latch, nipple held in mouth throughout feeding, stimulation needed to elicit sucking reflex. Intervention(s):  Adjust position;Assist with latch;Breast massage;Breast compression  Audible Swallowing: None Intervention(s): Skin to skin;Hand expression Intervention(s): Alternate breast massage  Type of Nipple: Everted at rest and after stimulation  Comfort (Breast/Nipple): Engorged, cracked, bleeding, large blisters, severe discomfort Problem noted: Cracked, bleeding, blisters, bruises Intervention(s): Double electric pump;Hand pump;Expressed breast milk to nipple  Problem noted: Severe discomfort Interventions (Severe discomfort): Double electric pum  Hold (Positioning): Full assist, staff holds infant at breast Intervention(s): Breastfeeding basics reviewed;Support Pillows;Position options;Skin to skin  LATCH Score: 3  Lactation Tools Discussed/Used Tools: Nipple Shields;Pump;Comfort gels;Bottle Nipple shield size: 20;16 Breast pump type: Double-Electric Breast Pump WIC Program: Yes Pump Review: Setup, frequency, and cleaning;Milk Storage Initiated by:: Peri JeffersonL. Markel Kurtenbach RN IBCLC Date initiated:: 10/09/16   Consult Status Consult Status: Follow-up Date: 10/10/16 Follow-up type: In-patient    Charyl DancerCARVER, Jakirah Zaun G 10/09/2016, 6:56 AM

## 2016-10-09 NOTE — Lactation Note (Signed)
This note was copied from a baby's chart. Lactation Consultation Note: Father feeding infant a bottle of formula when I arrived in the room. Mother reports that she has pumped 2 times today and "nothing came out." Discussion on supply and demand. Advised mother to continue to pump every 2-3 hours for 15-20 mins . Mother reports that she is unable to latch infant with the nipple shield. She reports that her nipples are scabbed and too painful to latch. Mother has comfort gels and coconut oil.  Mother has an electric pump at home for use. Mother offered to continue to follow up with mother and she was agreeable.   Patient Name: [name] Reason for consult: Follow-up assessment   Maternal Data    Feeding Feeding Type: Formula  LATCH Score/Interventions                      Lactation Tools Discussed/Used     Consult Status Consult Status: Follow-up Date: 10/09/16 Follow-up type: In-patient    Stevan BornKendrick, Adea Geisel [facility] Hospital[facility] Novak, 3:47 PM

## 2016-10-09 NOTE — Progress Notes (Signed)
Subjective: Postpartum Day 1: Cesarean Delivery Patient reports incisional pain and tolerating PO.  Nl lochia, pain controlled  Objective: Vital signs in last 24 hours: Temp:  [97.4 F (36.3 C)-98.4 F (36.9 C)] 97.8 F (36.6 C) (05/17 0451) Pulse Rate:  [51-78] 59 (05/17 0451) Resp:  [14-21] 20 (05/17 0451) BP: (81-104)/(40-61) 99/49 (05/17 0451) SpO2:  [95 %-100 %] 97 % (05/16 1509)  Physical Exam:  General: alert and no distress Lochia: appropriate Uterine Fundus: firm Incision: healing well DVT Evaluation: No evidence of DVT seen on physical exam.   Recent Labs  10/07/16 1045 10/09/16 0518  HGB 11.9* 10.0*  HCT 35.1* 29.3*    Assessment/Plan: Status post Cesarean section. Doing well postoperatively.  Continue current care.  Harrold Fitchett Bovard-Stuckert 10/09/2016, 9:00 AM

## 2016-10-10 MED ORDER — IBUPROFEN 800 MG PO TABS: 800.0000 mg | ORAL_TABLET | Freq: Three times a day (TID) | ORAL | 0 refills | Status: DC

## 2016-10-10 MED ORDER — OXYCODONE HCL 5 MG PO TABS: 5.0000 mg | ORAL_TABLET | ORAL | 0 refills | Status: DC | PRN

## 2016-10-10 NOTE — Progress Notes (Signed)
Subjective: Postpartum Day 2: Cesarean Delivery Patient reports tolerating PO and no problems voiding.  Requests d/c home  Objective: Vital signs in last 24 hours: Temp:  [98.2 F (36.8 C)] 98.2 F (36.8 C) (05/18 0603) Pulse Rate:  [67] 67 (05/18 0603) Resp:  [18] 18 (05/18 0603) BP: (110)/(68) 110/68 (05/18 0603)  Physical Exam:  General: alert and cooperative Lochia: appropriate Uterine Fundus: firm Incision: C/D/I    Recent Labs  10/07/16 1045 10/09/16 0518  HGB 11.9* 10.0*  HCT 35.1* 29.3*    Assessment/Plan: Status post Cesarean section. Doing well postoperatively.  Discharge home with standard precautions and return to office next week as scheduled for incision check and circumcision.  [name] 10/10/2016, 9:14 AM

## 2016-10-10 NOTE — Discharge Summary (Signed)
OB Discharge Summary     Patient Name: [name] DOB: [birth year] MRN: [id number]  Date of admission: 10/08/2016 Delivering MD: Sherian ReinBOVARD-STUCKERT, JODY   Date of discharge: 10/10/2016  Admitting diagnosis: repeat c-section Intrauterine pregnancy: 4262w0d     Secondary diagnosis:  Principal Problem:   S/P cesarean section Active Problems:   History of cesarean section, low transverse   Status post tubal ligation at time of delivery, current hospitalization  Additional problems: none     Discharge diagnosis: Term Pregnancy Delivered                                                                                                Post partum procedures:none  Complications: None  Hospital course:  Sceduled C/S   31 y.o. yo [id number] at 8162w0d was admitted to the hospital 10/08/2016 for scheduled cesarean section with the following indication:Elective Repeat.  Membrane Rupture Time/Date: 9:03 AM ,10/08/2016   Patient delivered a Viable infant.10/08/2016  Details of operation can be found in separate operative note.  Pateint had an uncomplicated postpartum course.  She is ambulating, tolerating a regular diet, passing flatus, and urinating well. Patient is discharged home in stable condition on  10/10/16         Physical exam  Vitals:   10/09/16 0130 10/09/16 0451 10/09/16 0900 10/10/16 0603  BP: (!) 93/46 (!) 99/49 (!) 95/52 110/68  Pulse: (!) 52 (!) 59 62 67  Resp: 20 20 18 18   Temp: 98.4 F (36.9 C) 97.8 F (36.6 C)  98.2 F (36.8 C)  TempSrc: Oral Oral  Oral  SpO2:      Weight:      Height:       General: alert and cooperative Lochia: appropriate Uterine Fundus: firm Incision: Dressing is clean, dry, and intact  Labs: Lab Results  Component Value Date   WBC 18.2 (H) 10/09/2016   HGB 10.0 (L) 10/09/2016   HCT 29.3 (L) 10/09/2016   MCV 95.4 10/09/2016   PLT 189 10/09/2016   CMP Latest Ref Rng & Units 03/27/2014  Glucose 70 - 99 mg/dL 89  BUN 6 - 23 mg/dL 6   Creatinine 1.470.50 - 8.291.10 mg/dL 5.620.60  Sodium 130137 - 865147 mEq/L 140  Potassium 3.7 - 5.3 mEq/L 4.1  Chloride 96 - 112 mEq/L 105  CO2 19 - 32 mEq/L -  Calcium 8.4 - 10.5 mg/dL -  Total Protein 6.0 - 8.3 g/dL 7.3  Total Bilirubin 0.3 - 1.2 mg/dL 0.3  Alkaline Phos 39 - 117 U/L 57  AST 0 - 37 U/L 14  ALT 0 - 35 U/L 7    Discharge instruction: per After Visit Summary and "Baby and Me Booklet".  After visit meds:  Allergies as of 10/10/2016      Reactions   Amoxicillin Hives, Itching   Has patient had a PCN reaction causing immediate rash, facial/tongue/throat swelling, SOB or lightheadedness with hypotension: No Has patient had a PCN reaction causing severe rash involving mucus membranes or skin necrosis: Yes Has patient had a PCN reaction that required hospitalization No Has patient had a PCN  reaction occurring within the last 10 years: Yes If all of the above answers are "NO", then may proceed with Cephalosporin use.   Bactrim [sulfamethoxazole-trimethoprim] Hives, Itching   Took both bactrim and amoxicillin at the same time unsure which caused the reaction      Medication List    STOP taking these medications   acetaminophen 500 MG tablet Commonly known as:  TYLENOL     TAKE these medications   diphenhydrAMINE 25 MG tablet Commonly known as:  BENADRYL Take 25 mg by mouth at bedtime as needed for allergies or sleep.   ibuprofen 800 MG tablet Commonly known as:  ADVIL,MOTRIN Take 1 tablet (800 mg total) by mouth every 8 (eight) hours.   neomycin-bacitracin-polymyxin ointment Commonly known as:  NEOSPORIN Apply 1 application topically as needed for wound care. apply to eye   oxyCODONE 5 MG immediate release tablet Commonly known as:  Oxy IR/ROXICODONE Take 1 tablet (5 mg total) by mouth every 4 (four) hours as needed (pain scale 4-7).   PRENATAL PO Take 1 tablet by mouth daily.   SLOW IRON PO Take 1 tablet by mouth every other day.       Diet: routine  diet  Activity: Advance as tolerated. Pelvic rest for 6 weeks.   Outpatient follow up: as scheduled next week Follow up Appt:No future appointments. Follow up Visit:No Follow-up on file.  Postpartum contraception: Tubal Ligation  Newborn Data: Live born female  Birth Weight: 8 lb 5.2 oz (3775 g) APGAR: 8, 9  Baby Feeding: Breast Disposition:home with mother   10/10/2016 Oliver Pila, MD

## 2016-10-30 ENCOUNTER — Other Ambulatory Visit: Payer: Self-pay | Admitting: Physician Assistant

## 2016-10-30 DIAGNOSIS — R10812 Left upper quadrant abdominal tenderness: Secondary | ICD-10-CM

## 2016-11-05 ENCOUNTER — Ambulatory Visit
Admission: RE | Admit: 2016-11-05 | Discharge: 2016-11-05 | Disposition: A | Discharge status: Home / Self Care | Payer: BLUE CROSS/BLUE SHIELD | Source: Ambulatory Visit | Attending: Physician Assistant | Admitting: Physician Assistant

## 2016-11-05 DIAGNOSIS — R10812 Left upper quadrant abdominal tenderness: Secondary | ICD-10-CM

## 2017-01-25 ENCOUNTER — Encounter (HOSPITAL_COMMUNITY): Payer: Self-pay

## 2017-01-25 ENCOUNTER — Inpatient Hospital Stay (HOSPITAL_COMMUNITY)
Admission: AD | Admit: 2017-01-25 | Discharge: 2017-01-25 | Disposition: A | Discharge status: Home / Self Care | Payer: BLUE CROSS/BLUE SHIELD | Source: Ambulatory Visit | Attending: Obstetrics and Gynecology | Admitting: Obstetrics and Gynecology

## 2017-01-25 DIAGNOSIS — Z882 Allergy status to sulfonamides status: Secondary | ICD-10-CM | POA: Insufficient documentation

## 2017-01-25 DIAGNOSIS — L0390 Cellulitis, unspecified: Secondary | ICD-10-CM | POA: Diagnosis not present

## 2017-01-25 DIAGNOSIS — Z880 Allergy status to penicillin: Secondary | ICD-10-CM | POA: Insufficient documentation

## 2017-01-25 DIAGNOSIS — R109 Unspecified abdominal pain: Secondary | ICD-10-CM

## 2017-01-25 DIAGNOSIS — IMO0001 Reserved for inherently not codable concepts without codable children: Secondary | ICD-10-CM

## 2017-01-25 DIAGNOSIS — T814XXA Infection following a procedure, initial encounter: Secondary | ICD-10-CM

## 2017-01-25 DIAGNOSIS — Z79899 Other long term (current) drug therapy: Secondary | ICD-10-CM | POA: Diagnosis not present

## 2017-01-25 DIAGNOSIS — F17210 Nicotine dependence, cigarettes, uncomplicated: Secondary | ICD-10-CM | POA: Insufficient documentation

## 2017-01-25 MED ORDER — CEPHALEXIN 500 MG PO CAPS: 500.0000 mg | ORAL_CAPSULE | Freq: Four times a day (QID) | ORAL | 0 refills | Status: DC

## 2017-01-25 NOTE — MAU Note (Signed)
Patient had C/S 5/16, having redness and pain at site, waking her up at night

## 2017-01-25 NOTE — MAU Provider Note (Signed)
History     CSN: [id number]  Arrival date and time: 01/25/17 1251    Chief Complaint  Patient presents with  . Abdominal Pain   [name] is a 31 y.o. [id number] s/p LTCS on 10/08/2016 reports incisional pain and redness. She states she's had intermittent dull pain on the left side of her incision over the past 4 months but yesterday had increase in pain and noted redness and firmness over incision. She denies systemic symptoms including chills, fever nausea or vomiting. She took high-dose ibuprofen without relief. Works as a Child psychotherapistwaitress. Denies any recent change in activity level or strenuous activity.     OB History  Gravida Para Term Preterm AB Living  4 2 2   2 2   SAB TAB Ectopic Multiple Live Births  2     0 2    # Outcome Date GA Lbr Len/2nd Weight Sex Delivery Anes PTL Lv  4 Term 10/08/16 5221w0d  8 lb 5.2 oz (3.775 kg) M CS-LTranv Spinal  LIV  3 SAB 2017          2 SAB 2016          1 Term 2008 4421w0d  7 lb 9 oz (3.43 kg) F CS-LTranv EPI  LIV     Past Medical History:  Diagnosis Date  . Gestational diabetes   . History of cesarean section, low transverse 10/07/2016  . S/P cesarean section 10/08/2016  . Status post tubal ligation at time of delivery, current hospitalization 10/08/2016  . Vaginal Pap smear, abnormal     Past Surgical History:  Procedure Laterality Date  . CESAREAN SECTION    . CESAREAN SECTION WITH BILATERAL TUBAL LIGATION Bilateral 10/08/2016   Procedure: REPEAT CESAREAN SECTION WITH BILATERAL TUBAL LIGATION;  Surgeon: Sherian ReinBovard-Stuckert, Jody, MD;  Location: [facility];  Service: Obstetrics;  Laterality: Bilateral;  Heather K to RNFA  . CHOLECYSTECTOMY    . LEEP    . LEEP    . LEEP      History reviewed. No pertinent family history.  Social History  Substance Use Topics  . Smoking status: Current Every Day Smoker    Packs/day: 0.50    Years: 5.00    Types: Cigarettes  . Smokeless tobacco: Never Used  . Alcohol use No    Allergies:   Allergies  Allergen Reactions  . Amoxicillin Hives and Itching    Has patient had a PCN reaction causing immediate rash, facial/tongue/throat swelling, SOB or lightheadedness with hypotension: No Has patient had a PCN reaction causing severe rash involving mucus membranes or skin necrosis: Yes Has patient had a PCN reaction that required hospitalization No Has patient had a PCN reaction occurring within the last 10 years: Yes If all of the above answers are "NO", then may proceed with Cephalosporin use.   . Bactrim [Sulfamethoxazole-Trimethoprim] Hives and Itching    Took both bactrim and amoxicillin at the same time unsure which caused the reaction    Prescriptions Prior to Admission  Medication Sig Dispense Refill Last Dose  . diphenhydrAMINE (BENADRYL) 25 MG tablet Take 25 mg by mouth at bedtime as needed for allergies or sleep.     . Ferrous Sulfate Dried (SLOW IRON PO) Take 1 tablet by mouth every other day.     . ibuprofen (ADVIL,MOTRIN) 800 MG tablet Take 1 tablet (800 mg total) by mouth every 8 (eight) hours. 30 tablet 0   . neomycin-bacitracin-polymyxin (NEOSPORIN) ointment Apply 1 application topically as needed for wound care.  apply to eye     . oxyCODONE (OXY IR/ROXICODONE) 5 MG immediate release tablet Take 1 tablet (5 mg total) by mouth every 4 (four) hours as needed (pain scale 4-7). 30 tablet 0   . Prenatal Vit-Fe Fumarate-FA (PRENATAL PO) Take 1 tablet by mouth daily.       Review of Systems  Constitutional: Negative for appetite change, chills and fever.  Gastrointestinal: Positive for abdominal pain. Negative for constipation, diarrhea and nausea.  Genitourinary: Negative for dysuria.  Musculoskeletal: Negative for back pain.  Neurological: Positive for headaches. Negative for light-headedness.  Psychiatric/Behavioral: Negative for agitation. The patient is not nervous/anxious.    Physical Exam   Blood pressure 114/68, pulse 75, temperature 98 F (36.7 C),  temperature source Oral, resp. rate 16, height 5' 1.5" (1.562 m), weight 133 lb (60.3 kg), SpO2 100 %, unknown if currently breastfeeding.  Physical Exam  Nursing note and vitals reviewed. Constitutional: She is oriented to person, place, and time. She appears well-developed and well-nourished.  HENT:  Head: Normocephalic.  Eyes: Pupils are equal, round, and reactive to light.  Neck: Neck supple. No thyromegaly present.  Cardiovascular: Normal rate.   Respiratory: Effort normal.  GI: Soft.  Pain and still incision well-healed left center has overlying induration and 5 cm x 2 cm area of erythema. The area is outlined. No drainage or fluctuance.  Neurological: She is alert and oriented to person, place, and time.  Skin: Skin is warm and dry.  Psychiatric: She has a normal mood and affect. Her behavior is normal.    MAU Course  Procedures None  C/W Dr. Mindi Slicker re: findings consistent with early superficial cellulitis  Assessment and Plan   1. Wound cellulitis after surgery, initial encounter    Allergies as of 01/25/2017      Reactions   Amoxicillin Hives, Itching   Has patient had a PCN reaction causing immediate rash, facial/tongue/throat swelling, SOB or lightheadedness with hypotension: No Has patient had a PCN reaction causing severe rash involving mucus membranes or skin necrosis: Yes Has patient had a PCN reaction that required hospitalization No Has patient had a PCN reaction occurring within the last 10 years: Yes If all of the above answers are "NO", then may proceed with Cephalosporin use.   Bactrim [sulfamethoxazole-trimethoprim] Hives, Itching   Took both bactrim and amoxicillin at the same time unsure which caused the reaction      Medication List    STOP taking these medications   diphenhydrAMINE 25 MG tablet Commonly known as:  BENADRYL   neomycin-bacitracin-polymyxin ointment Commonly known as:  NEOSPORIN   oxyCODONE 5 MG immediate release tablet Commonly  known as:  Oxy IR/ROXICODONE   PRENATAL PO   SLOW IRON PO     TAKE these medications   cephALEXin 500 MG capsule Commonly known as:  KEFLEX Take 1 capsule (500 mg total) by mouth 4 (four) times daily.   ibuprofen 800 MG tablet Commonly known as:  ADVIL,MOTRIN Take 1 tablet (800 mg total) by mouth every 8 (eight) hours.            Discharge Care Instructions        Start     Ordered   01/25/17 0000  cephALEXin (KEFLEX) 500 MG capsule  4 times daily    Question:  Supervising Provider  Answer:  Tereso Newcomer   01/25/17 1406     Follow-up Information    Bovard-Stuckert, Augusto Gamble, MD. Schedule an appointment as soon as possible for  a visit on 01/27/2017.   Specialty:  Obstetrics and Gynecology Contact information: [address] [place] [address] [phone]           Deirdre Poe CNM 01/25/2017, 1:42 PM

## 2017-01-25 NOTE — Discharge Instructions (Signed)
Wound Infection A wound infection happens when germs start to grow in the wound. Germs that cause wound infections are most commonly bacteria. Other types of infections can occur as well. In some cases, infection can cause the wound to break open. Wound infections need treatment. If a wound infection is left untreated, complications can occur. This may include an infection in your bloodstream (sepsis) or in a bone (osteomyelitis). What are the causes? This condition is caused by germs growing in the wound. What increases the risk? The following factors may make you more likely to develop this condition:  Having a weak body defense system (immune system).  Having diabetes.  Taking steroid medicines for a long time (chronic use).  Smoking.  Older age.  Being overweight.  What are the signs or symptoms? Symptoms of this condition include:  Having more redness, swelling, or pain at the wound site.  Having more blood, pus, or fluid at the wound site.  A bad smell coming from a wound or bandage (dressing).  Having a fever.  Feeling tired or fatigued.  How is this diagnosed? This condition is diagnosed with a medical history and physical exam. You may also have blood tests. How is this treated? This condition is treated with an antibiotic medicine. The infection should improve 24-48 hours after you start antibiotics. Any redness around the wound should stop spreading, and the wound should be less painful. Follow these instructions at home: Medicines  Take or apply over-the-counter and prescription medicines only as told by your health care provider.  If you were prescribed antibiotic medicine, take or apply it as told by your health care provider. Do not stop using the antibiotic even if your condition improves. Wound care  Clean the wound each day or as told by your health care provider. ? Wash the wound with mild soap and water. ? Rinse the wound with water to remove all  soap. ? Pat the wound dry with a clean towel. Do not rub it.  Follow instructions from your health care provider about how to take care of your wound. Make sure you: ? Wash your hands with soap and water before you change your dressing. If soap and water are not available, use hand sanitizer. ? Change your dressing as told by your health care provider. ? Leave stitches (sutures), skin glue, or adhesive strips in place, if this applies. These skin closures may need to stay in place for 2 weeks or longer. If adhesive strip edges start to loosen and curl up, you may trim the loose edges. Do not remove adhesive strips completely unless your health care provider tells you to do that. Some wounds are left open to heal on their own.  Check your wound every day for signs of infection. Watch for: ? More redness, swelling, or pain. ? More fluid or blood. ? Warmth. ? Pus or a bad smell. General instructions  Keep the dressing dry until your health care provider says it can be removed.  Do not take baths, swim, use a hot tub, or do anything that would put your wound underwater until your health care provider approves.  Raise (elevate) the injured area above the level of your heart while you are sitting or lying down.  Do not scratch or pick at the wound.  Keep all follow-up visits as told by your health care provider. This is important. Contact a health care provider if:  Your pain is not controlled with medicine.  You have more   redness, swelling, or pain around your wound.  You have more fluid or blood coming from your wound.  Your wound feels warm to the touch.  You have pus coming from your wound.  You continue to notice a bad smell coming from your wound or your dressing.  Your wound that was closed breaks open. Get help right away if:  You have a red streak going away from your wound.  You have a fever. This information is not intended to replace advice given to you by your  health care provider. Make sure you discuss any questions you have with your health care provider. Document Released: 02/08/2003 Document Revised: 10/24/2015 Document Reviewed: 10/30/2014 Elsevier Interactive Patient Education  2018 [employer] Inc[employer]  

## 2017-07-06 ENCOUNTER — Other Ambulatory Visit: Payer: Self-pay | Admitting: Family Medicine

## 2017-07-06 DIAGNOSIS — R1906 Epigastric swelling, mass or lump: Secondary | ICD-10-CM

## 2017-07-24 ENCOUNTER — Other Ambulatory Visit (HOSPITAL_COMMUNITY): Payer: Self-pay | Admitting: Family Medicine

## 2017-07-24 DIAGNOSIS — R1906 Epigastric swelling, mass or lump: Secondary | ICD-10-CM

## 2017-07-29 ENCOUNTER — Ambulatory Visit (HOSPITAL_COMMUNITY): Payer: BLUE CROSS/BLUE SHIELD

## 2017-07-29 ENCOUNTER — Encounter (HOSPITAL_COMMUNITY): Payer: Self-pay

## 2017-07-29 ENCOUNTER — Ambulatory Visit (HOSPITAL_COMMUNITY)
Admission: RE | Admit: 2017-07-29 | Discharge: 2017-07-29 | Disposition: A | Discharge status: Home / Self Care | Payer: BLUE CROSS/BLUE SHIELD | Source: Ambulatory Visit | Attending: Family Medicine | Admitting: Family Medicine

## 2017-07-29 DIAGNOSIS — R1906 Epigastric swelling, mass or lump: Secondary | ICD-10-CM

## 2018-05-26 DIAGNOSIS — I499 Cardiac arrhythmia, unspecified: Secondary | ICD-10-CM

## 2018-05-26 HISTORY — DX: Cardiac arrhythmia, unspecified: I49.9

## 2018-07-06 ENCOUNTER — Ambulatory Visit
Admission: EM | Admit: 2018-07-06 | Discharge: 2018-07-06 | Disposition: A | Discharge status: Home / Self Care | Payer: 59

## 2018-07-06 DIAGNOSIS — F17210 Nicotine dependence, cigarettes, uncomplicated: Secondary | ICD-10-CM

## 2018-07-06 DIAGNOSIS — J09X2 Influenza due to identified novel influenza A virus with other respiratory manifestations: Secondary | ICD-10-CM

## 2018-07-06 DIAGNOSIS — J101 Influenza due to other identified influenza virus with other respiratory manifestations: Secondary | ICD-10-CM

## 2018-07-06 LAB — POCT INFLUENZA A/B
Influenza A, POC: POSITIVE — AB
Influenza B, POC: NEGATIVE

## 2018-07-06 MED ORDER — OSELTAMIVIR PHOSPHATE 75 MG PO CAPS: 75.0000 mg | ORAL_CAPSULE | Freq: Two times a day (BID) | ORAL | 0 refills | Status: DC

## 2018-07-06 MED ORDER — ALBUTEROL SULFATE 108 (90 BASE) MCG/ACT IN AEPB: 1.0000 | INHALATION_SPRAY | RESPIRATORY_TRACT | 0 refills | Status: DC | PRN

## 2018-07-06 MED ORDER — IBUPROFEN 600 MG PO TABS: 600.0000 mg | ORAL_TABLET | Freq: Four times a day (QID) | ORAL | 0 refills | Status: DC | PRN

## 2018-07-06 NOTE — ED Triage Notes (Signed)
Pt c/o cold sx's last week, took otc and felt better, today having pain behind rt breast when taking a deep breath.

## 2018-07-06 NOTE — ED Provider Notes (Signed)
[facility]    CSN: [id number] Arrival date & time: 07/06/18  1706     History   Chief Complaint Chief Complaint  Patient presents with  . Cough    HPI [name] is a 33 y.o. female.   Subjective:   [name] is a 33 y.o. female who presents for evaluation of influenza like symptoms. Symptoms include suspected fevers but not measured at home, chest pain with deep breathing, headache, myalgias, productive cough and malaise. Symptoms have been present for 3 days. She has tried to alleviate the symptoms with nothing with no relief. High risk factors for influenza complications: none.  She denies any known flu contacts.  She has not had a flu vaccine this season.  The following portions of the patient's history were reviewed and updated as appropriate: allergies, current medications, past family history, past medical history, past social history, past surgical history and problem list.       Past Medical History:  Diagnosis Date  . Gestational diabetes   . History of cesarean section, low transverse 10/07/2016  . S/P cesarean section 10/08/2016  . Status post tubal ligation at time of delivery, current hospitalization 10/08/2016  . Vaginal Pap smear, abnormal     Patient Active Problem List   Diagnosis Date Noted  . S/P cesarean section 10/08/2016  . Status post tubal ligation at time of delivery, current hospitalization 10/08/2016  . History of cesarean section, low transverse 10/07/2016  . GERD 03/07/2008  . ABDOMINAL PAIN 03/02/2008  . ABDOMINAL XRAY, ABNORMAL 03/02/2008    Past Surgical History:  Procedure Laterality Date  . CESAREAN SECTION    . CESAREAN SECTION WITH BILATERAL TUBAL LIGATION Bilateral 10/08/2016   Procedure: REPEAT CESAREAN SECTION WITH BILATERAL TUBAL LIGATION;  Surgeon: Sherian Rein, MD;  Location: [facility];  Service: Obstetrics;  Laterality: Bilateral;  Heather K to RNFA  . CHOLECYSTECTOMY    . LEEP      . LEEP    . LEEP      OB History    Gravida  4   Para  2   Term  2   Preterm      AB  2   Living  2     SAB  2   TAB      Ectopic      Multiple  0   Live Births  2            Home Medications    Prior to Admission medications   Medication Sig Start Date End Date Taking? Authorizing Provider  buprenorphine (SUBUTEX) 8 MG SUBL SL tablet Place 8 mg under the tongue once. 1 1/2 strips a day   Yes [provider]  buPROPion (WELLBUTRIN) 100 MG tablet Take 150 mg by mouth daily.   Yes [provider]  Albuterol Sulfate (PROAIR RESPICLICK) 108 (90 Base) MCG/ACT AEPB Inhale 1-2 puffs into the lungs every 4 (four) hours as needed (shortness of breath). 07/06/18   Lurline Idol, FNP  ibuprofen (ADVIL,MOTRIN) 600 MG tablet Take 1 tablet (600 mg total) by mouth every 6 (six) hours as needed. 07/06/18   Lurline Idol, FNP  oseltamivir (TAMIFLU) 75 MG capsule Take 1 capsule (75 mg total) by mouth every 12 (twelve) hours. 07/06/18   Lurline Idol, FNP    Family History No family history on file.  Social History Social History   Tobacco Use  . Smoking status: Current Every Day Smoker    Packs/day: 0.50  Years: 5.00    Pack years: 2.50    Types: Cigarettes  . Smokeless tobacco: Never Used  Substance Use Topics  . Alcohol use: No  . Drug use: No     Allergies   Amoxicillin and Bactrim [sulfamethoxazole-trimethoprim]   Review of Systems Review of Systems  Constitutional: Positive for fatigue and fever.  Respiratory: Positive for cough.   Musculoskeletal: Positive for myalgias.  Neurological: Positive for headaches.  All other systems reviewed and are negative.    Physical Exam Triage Vital Signs ED Triage Vitals [07/06/18 1733]  Enc Vitals Group     BP 109/67     Pulse Rate 73     Resp 18     Temp 98 F (36.7 C)     Temp Source Oral     SpO2 98 %     Weight      Height      Head Circumference      Peak Flow       Pain Score 0     Pain Loc      Pain Edu?      Excl. in GC?    No data found.  Updated Vital Signs BP 109/67 (BP Location: Left Arm)   Pulse 73   Temp 98 F (36.7 C) (Oral)   Resp 18   LMP 06/10/2018   SpO2 98%   Visual Acuity Right Eye Distance:   Left Eye Distance:   Bilateral Distance:    Right Eye Near:   Left Eye Near:    Bilateral Near:     Physical Exam Vitals signs reviewed.  Constitutional:      Appearance: Normal appearance.  HENT:     Head: Normocephalic.     Right Ear: Tympanic membrane, ear canal and external ear normal.     Left Ear: Tympanic membrane, ear canal and external ear normal.     Nose: Nose normal.     Mouth/Throat:     Mouth: Mucous membranes are moist.  Eyes:     Pupils: Pupils are equal, round, and reactive to light.  Neck:     Musculoskeletal: Normal range of motion and neck supple.  Cardiovascular:     Rate and Rhythm: Normal rate and regular rhythm.     Heart sounds: Normal heart sounds.  Pulmonary:     Effort: Pulmonary effort is normal.     Breath sounds: Normal breath sounds.  Chest:     Chest wall: No tenderness.  Musculoskeletal: Normal range of motion.  Lymphadenopathy:     Cervical: No cervical adenopathy.  Skin:    General: Skin is warm and dry.  Neurological:     General: No focal deficit present.     Mental Status: She is alert and oriented to person, place, and time.      UC Treatments / Results  Labs (all labs ordered are listed, but only abnormal results are displayed) Labs Reviewed  POCT INFLUENZA A/B - Abnormal; Notable for the following components:      Result Value   Influenza A, POC Positive (*)    All other components within normal limits    EKG None  Radiology No results found.  Procedures Procedures (including critical care time)  Medications Ordered in UC Medications - No data to display  Initial Impression / Assessment and Plan / UC Course  I have reviewed the triage vital signs and  the nursing notes.  Pertinent labs & imaging results that were available during  my care of the patient were reviewed by me and considered in my medical decision making (see chart for details).     33 year old female presenting with a 3-day history of subjective fevers, chest pain with deep breathing, headache, myalgias, productive cough and malaise.  Patient is afebrile.  Vital signs stable.  Physical exam unremarkable.  She is influenza A positive. Supportive care with appropriate antipyretics and fluids. Antivirals per orders. Follow up as needed.  Today's evaluation has revealed no signs of a dangerous process. Discussed diagnosis with patient. Patient aware of their diagnosis, possible red flag symptoms to watch out for and need for close follow up. Patient understands verbal and written discharge instructions. Patient comfortable with plan and disposition.  Patient has a clear mental status at this time, good insight into illness (after discussion and teaching) and has clear judgment to make decisions regarding their care.  Documentation was completed with the aid of voice recognition software. Transcription may contain typographical errors. Final Clinical Impressions(s) / UC Diagnoses   Final diagnoses:  Influenza A     Discharge Instructions     You have the flu. Take medications as prescribed. Drink plenty of fluids and get lots of rest.     ED Prescriptions    Medication Sig Dispense Auth. Provider   oseltamivir (TAMIFLU) 75 MG capsule Take 1 capsule (75 mg total) by mouth every 12 (twelve) hours. 10 capsule Lurline IdolMurrill, Moe Graca, FNP   ibuprofen (ADVIL,MOTRIN) 600 MG tablet Take 1 tablet (600 mg total) by mouth every 6 (six) hours as needed. 30 tablet Lurline IdolMurrill, Desaray Marschner, FNP   Albuterol Sulfate (PROAIR RESPICLICK) 108 (90 Base) MCG/ACT AEPB Inhale 1-2 puffs into the lungs every 4 (four) hours as needed (shortness of breath). 1 each Lurline IdolMurrill, Valta Dillon, FNP     Controlled Substance  Prescriptions [place] Controlled Substance Registry consulted? Not Applicable   Lurline IdolMurrill, Brinly Maietta, FNP 07/06/18 1815

## 2018-07-06 NOTE — Discharge Instructions (Signed)
You have the flu. Take medications as prescribed. Drink plenty of fluids and get lots of rest.

## 2019-07-15 ENCOUNTER — Encounter (HOSPITAL_COMMUNITY): Payer: Self-pay

## 2019-07-15 ENCOUNTER — Emergency Department (HOSPITAL_COMMUNITY)
Admission: EM | Admit: 2019-07-15 | Discharge: 2019-07-15 | Disposition: A | Discharge status: Home / Self Care | Payer: Commercial Managed Care - PPO | Attending: Emergency Medicine | Admitting: Emergency Medicine

## 2019-07-15 ENCOUNTER — Emergency Department (HOSPITAL_COMMUNITY): Payer: Commercial Managed Care - PPO

## 2019-07-15 ENCOUNTER — Other Ambulatory Visit: Payer: Self-pay

## 2019-07-15 DIAGNOSIS — Z79899 Other long term (current) drug therapy: Secondary | ICD-10-CM | POA: Insufficient documentation

## 2019-07-15 DIAGNOSIS — R1033 Periumbilical pain: Secondary | ICD-10-CM | POA: Diagnosis present

## 2019-07-15 DIAGNOSIS — K429 Umbilical hernia without obstruction or gangrene: Secondary | ICD-10-CM

## 2019-07-15 DIAGNOSIS — K5900 Constipation, unspecified: Secondary | ICD-10-CM | POA: Diagnosis not present

## 2019-07-15 DIAGNOSIS — Z87891 Personal history of nicotine dependence: Secondary | ICD-10-CM | POA: Insufficient documentation

## 2019-07-15 LAB — CBC
HCT: 37.8 % (ref 36.0–46.0)
Hemoglobin: 12.6 g/dL (ref 12.0–15.0)
MCH: 30.8 pg (ref 26.0–34.0)
MCHC: 33.3 g/dL (ref 30.0–36.0)
MCV: 92.4 fL (ref 80.0–100.0)
Platelets: 219 10*3/uL (ref 150–400)
RBC: 4.09 MIL/uL (ref 3.87–5.11)
RDW: 12.3 % (ref 11.5–15.5)
WBC: 8.1 10*3/uL (ref 4.0–10.5)
nRBC: 0 % (ref 0.0–0.2)

## 2019-07-15 LAB — COMPREHENSIVE METABOLIC PANEL
ALT: 27 U/L (ref 0–44)
AST: 27 U/L (ref 15–41)
Albumin: 5 g/dL (ref 3.5–5.0)
Alkaline Phosphatase: 57 U/L (ref 38–126)
Anion gap: 7 (ref 5–15)
BUN: 9 mg/dL (ref 6–20)
CO2: 24 mmol/L (ref 22–32)
Calcium: 9.2 mg/dL (ref 8.9–10.3)
Chloride: 105 mmol/L (ref 98–111)
Creatinine, Ser: 0.51 mg/dL (ref 0.44–1.00)
GFR calc Af Amer: >60 mL/min (ref >60)
GFR calc non Af Amer: >60 mL/min (ref >60)
Glucose, Bld: 86 mg/dL (ref 70–99)
Potassium: 3.3 mmol/L — ABNORMAL LOW (ref 3.5–5.1)
Sodium: 136 mmol/L (ref 135–145)
Total Bilirubin: 0.5 mg/dL (ref 0.3–1.2)
Total Protein: 8 g/dL (ref 6.5–8.1)

## 2019-07-15 LAB — URINALYSIS, ROUTINE W REFLEX MICROSCOPIC
Bilirubin Urine: NEGATIVE
Glucose, UA: NEGATIVE mg/dL
Hgb urine dipstick: NEGATIVE
Ketones, ur: NEGATIVE mg/dL
Nitrite: NEGATIVE
Protein, ur: NEGATIVE mg/dL
Specific Gravity, Urine: 1.003 — ABNORMAL LOW (ref 1.005–1.030)
pH: 6 (ref 5.0–8.0)

## 2019-07-15 LAB — LIPASE, BLOOD: Lipase: 18 U/L (ref 11–51)

## 2019-07-15 LAB — I-STAT BETA HCG BLOOD, ED (MC, WL, AP ONLY): I-stat hCG, quantitative: <5 m[IU]/mL (ref <5)

## 2019-07-15 MED ORDER — SODIUM CHLORIDE 0.9% FLUSH: 3.0000 mL | Freq: Once | INTRAVENOUS | Status: DC

## 2019-07-15 MED ORDER — IOHEXOL 300 MG/ML  SOLN
100.0000 mL | Freq: Once | INTRAMUSCULAR | Status: AC | PRN
  Administered 2019-07-15: 100 mL via INTRAVENOUS

## 2019-07-15 MED ORDER — SODIUM CHLORIDE 0.9 % IV BOLUS
1000.0000 mL | Freq: Once | INTRAVENOUS | Status: DC
Start: 2019-07-15 — End: 2019-07-16

## 2019-07-15 NOTE — ED Provider Notes (Signed)
[facility] DEPT Provider Note   CSN: [id number] Arrival date & time: 07/15/19  1601     History Chief Complaint  Patient presents with  . Abdominal Pain    [name] is a 34 y.o. female with past medical history of GERD, status post C-section x2 and bilateral tubal ligation, who presents today for evaluation of abdominal pain. She reports that she was picking up a heavy container of tea at work and moving it and had sudden onset of severe pain around her umbilicus.  She reports that at the time the pain was excruciating.  She states that previously she has had pain in this area.  She states that the pain was associated with a bulge in the umbilicus.  She reports her last bowel movement was this morning and was normal for her. She denies any fevers.  She reports that she has had a bulge before however it has never hurt this much. She states that this first started happening after she had a laparoscopic cholecystectomy with an incision around the umbilicus shortly followed by pregnancy.  She reports that over the past few days she has been feeling very bloated. She states she called her GI prior to coming here and they did not have any appointments and referred her here.   HPI     Past Medical History:  Diagnosis Date  . Gestational diabetes   . History of cesarean section, low transverse 10/07/2016  . S/P cesarean section 10/08/2016  . Status post tubal ligation at time of delivery, current hospitalization 10/08/2016  . Vaginal Pap smear, abnormal     Patient Active Problem List   Diagnosis Date Noted  . S/P cesarean section 10/08/2016  . Status post tubal ligation at time of delivery, current hospitalization 10/08/2016  . History of cesarean section, low transverse 10/07/2016  . GERD 03/07/2008  . ABDOMINAL PAIN 03/02/2008  . ABDOMINAL XRAY, ABNORMAL 03/02/2008    Past Surgical History:  Procedure Laterality Date  . CESAREAN SECTION      . CESAREAN SECTION WITH BILATERAL TUBAL LIGATION Bilateral 10/08/2016   Procedure: REPEAT CESAREAN SECTION WITH BILATERAL TUBAL LIGATION;  Surgeon: Janyth Contes, MD;  Location: [facility];  Service: Obstetrics;  Laterality: Bilateral;  Heather K to RNFA  . CHOLECYSTECTOMY    . LEEP    . LEEP    . LEEP       OB History    Gravida  4   Para  2   Term  2   Preterm      AB  2   Living  2     SAB  2   TAB      Ectopic      Multiple  0   Live Births  2           History reviewed. No pertinent family history.  Social History   Tobacco Use  . Smoking status: Former Smoker    Packs/day: 0.50    Years: 5.00    Pack years: 2.50    Types: Cigarettes  . Smokeless tobacco: Never Used  Substance Use Topics  . Alcohol use: Never  . Drug use: No    Home Medications Prior to Admission medications   Medication Sig Start Date End Date Taking? Authorizing Provider  Albuterol Sulfate (PROAIR RESPICLICK) 850 (90 Base) MCG/ACT AEPB Inhale 1-2 puffs into the lungs every 4 (four) hours as needed (shortness of breath). 07/06/18  Yes Enrique Sack, FNP  Multiple Vitamins-Minerals (MULTIVITAMIN ADULTS) TABS Take 1 tablet by mouth daily.   Yes [provider]  SUBOXONE 8-2 MG FILM Place 12.3 mg of opioid under the tongue daily.  07/04/19  Yes [provider]  varenicline (CHANTIX) 1 MG tablet Take 1 mg by mouth 2 (two) times daily.   Yes [provider]  vitamin B-12 (CYANOCOBALAMIN) 1000 MCG tablet Take 1,000 mcg by mouth daily.   Yes [provider]  ibuprofen (ADVIL,MOTRIN) 600 MG tablet Take 1 tablet (600 mg total) by mouth every 6 (six) hours as needed. Patient not taking: Reported on 07/15/2019 07/06/18   Lurline Idol, FNP  oseltamivir (TAMIFLU) 75 MG capsule Take 1 capsule (75 mg total) by mouth every 12 (twelve) hours. Patient not taking: Reported on 07/15/2019 07/06/18   Lurline Idol, FNP    Allergies     Amoxicillin and Bactrim [sulfamethoxazole-trimethoprim]  Review of Systems   Review of Systems  Constitutional: Negative for chills and fever.  Respiratory: Negative for chest tightness and shortness of breath.   Cardiovascular: Negative for chest pain.  Gastrointestinal: Positive for abdominal pain. Negative for constipation, diarrhea, nausea and vomiting.  Musculoskeletal: Negative for back pain and neck pain.  All other systems reviewed and are negative.   Physical Exam Updated Vital Signs BP 111/75 (BP Location: Left Arm)   Pulse 63   Temp 98 F (36.7 C) (Oral)   Resp 16   Ht 5\' 1"  (1.549 m)   Wt 58.1 kg   LMP 06/06/2019 (Approximate)   SpO2 100%   BMI 24.19 kg/m   Physical Exam Vitals and nursing note reviewed.  Constitutional:      General: She is not in acute distress.    Appearance: She is well-developed.  HENT:     Head: Normocephalic and atraumatic.  Eyes:     Conjunctiva/sclera: Conjunctivae normal.  Cardiovascular:     Rate and Rhythm: Normal rate and regular rhythm.     Heart sounds: No murmur.  Pulmonary:     Effort: Pulmonary effort is normal. No respiratory distress.     Breath sounds: Normal breath sounds.  Abdominal:     General: A surgical scar is present. There is distension.     Palpations: Abdomen is soft.     Tenderness: There is abdominal tenderness (Mild TTP and firmness supraumbilically) in the periumbilical area. There is no guarding or rebound.     Hernia: A hernia is present. Hernia is present in the umbilical area.  Musculoskeletal:     Cervical back: Neck supple.  Skin:    General: Skin is warm and dry.  Neurological:     General: No focal deficit present.     Mental Status: She is alert.     Cranial Nerves: No cranial nerve deficit.  Psychiatric:        Mood and Affect: Mood normal.        Behavior: Behavior normal.     ED Results / Procedures / Treatments   Labs (all labs ordered are listed, but only abnormal results are  displayed) Labs Reviewed  COMPREHENSIVE METABOLIC PANEL - Abnormal; Notable for the following components:      Result Value   Potassium 3.3 (*)    All other components within normal limits  URINALYSIS, ROUTINE W REFLEX MICROSCOPIC - Abnormal; Notable for the following components:   Color, Urine STRAW (*)    Specific Gravity, Urine 1.003 (*)    Leukocytes,Ua TRACE (*)    Bacteria, UA RARE (*)  All other components within normal limits  LIPASE, BLOOD  CBC  I-STAT BETA HCG BLOOD, ED ([facility], [facility], [facility] ONLY)    EKG None  Radiology CT Abdomen Pelvis W Contrast  Result Date: 07/15/2019 CLINICAL DATA:  34 year old female with abdominal pain. EXAM: CT ABDOMEN AND PELVIS WITH CONTRAST TECHNIQUE: Multidetector CT imaging of the abdomen and pelvis was performed using the standard protocol following bolus administration of intravenous contrast. CONTRAST:  OMNIPAQUE IOHEXOL 300 MG/ML  SOLN COMPARISON:  CT abdomen pelvis dated 12/20/2018. FINDINGS: Lower chest: Several small bilateral pulmonary nodules measure up to 5 mm in the left lower lobe (series 4, image 45) similar to prior CT. The visualized lung bases are otherwise clear. No intra-abdominal free air. Probable small free fluid in the pelvis. Hepatobiliary: The liver is unremarkable. No intrahepatic biliary ductal dilatation. Cholecystectomy. No retained calcified stone noted in the central CBD. Pancreas: Unremarkable. No pancreatic ductal dilatation or surrounding inflammatory changes. Spleen: Normal in size without focal abnormality. Adrenals/Urinary Tract: The adrenal glands are unremarkable. The kidneys, visualized ureters, and urinary bladder appear unremarkable. Stomach/Bowel: There is moderate amount of stool throughout the colon. No bowel obstruction or active inflammation. The appendix is normal. Vascular/Lymphatic: Minimal atherosclerotic calcification of the abdominal aorta. The aorta and IVC are otherwise unremarkable. No portal venous  gas. There is no adenopathy. Reproductive: The uterus is retroverted or retroflexed. There is a 2.2 cm left ovarian corpus luteum. Other: Small fat containing umbilical hernia. No fluid collection. Minimal haziness of the herniated fat. Correlation with clinical exam is recommended to exclude a degree of incarceration. Musculoskeletal: No acute or significant osseous findings. IMPRESSION: 1. Moderate colonic stool burden. No bowel obstruction or active inflammation. Normal appendix. 2. A 2.2 cm left ovarian corpus luteum. 3. Small fat containing umbilical hernia. Electronically Signed   By: Elgie Collard M.D.   On: 07/15/2019 18:22    Procedures Procedures (including critical care time)  Medications Ordered in ED Medications  sodium chloride flush (NS) 0.9 % injection 3 mL (3 mLs Intravenous Not Given 07/15/19 1914)  sodium chloride 0.9 % bolus 1,000 mL (1,000 mLs Intravenous Not Given 07/15/19 1803)  iohexol (OMNIPAQUE) 300 MG/ML solution 100 mL (100 mLs Intravenous Contrast Given 07/15/19 1805)    ED Course  I have reviewed the triage vital signs and the nursing notes.  Pertinent labs & imaging results that were available during my care of the patient were reviewed by me and considered in my medical decision making (see chart for details).  Clinical Course as of Jul 15 1999  Fri Jul 15, 2019  1858 I spoke with Dr. Magnus Ivan of general surgery.  Hernia is fat containing.  No acute intervention.  Outpatient follow up and pain management.    [EH]    Clinical Course User Index [EH] Norman Clay   MDM Rules/Calculators/A&P                     Patient presents today for evaluation of abdominal pain that started shortly prior to arrival around her umbilicus.  She describes what sounds like an umbilical hernia that has been intermittently painful in the past however she states these episodes are becoming more frequent and more painful. On exam she does have some localized  tenderness to palpation around her umbilicus.  She is hemodynamically stable, not febrile tachycardic or tachypneic. Labs are obtained and reviewed, she does not have a significant leukocytosis.  CMP and lipase are unremarkable.  Pregnancy  test is negative.  Urine with trace leukocytes and rare bacteria, 0-5 squamous epithelial cells, is not consistent with a urinary tract infection at this time. CT abdomen pelvis was obtained showing a umbilical fat-containing hernia with surrounding stranding. I spoke with Dr. Magnus Ivan of general surgery who recommends outpatient follow-up for repair as an outpatient. I discussed pain control options with patient including possibility of narcotics prescription.  She takes Suboxone at baseline and therefore declined an opioid prescription for home.  We discussed pain control options at home including OTC medications.   Return precautions were discussed with patient who states their understanding.  At the time of discharge patient denied any unaddressed complaints or concerns.  Patient is agreeable for discharge home.  Note: Portions of this report may have been transcribed using voice recognition software. Every effort was made to ensure accuracy; however, inadvertent computerized transcription errors may be present  Final Clinical Impression(s) / ED Diagnoses Final diagnoses:  Umbilical hernia without obstruction and without gangrene  Constipation, unspecified constipation type    Rx / DC Orders ED Discharge Orders    None       Norman Clay 07/15/19 2145    Pricilla Loveless, MD 07/16/19 0028

## 2019-07-15 NOTE — Discharge Instructions (Signed)
Please take Tylenol (acetaminophen) to relieve your pain.  You may take tylenol, up to 1,000 mg (two extra strength pills).  Do not take more than 3,000 mg tylenol in a 24 hour period.  Please check all medication labels as many medications such as pain and cold medications may contain tylenol. Please do not drink alcohol while taking this medication.   Please start taking MiraLAX 1 capful 1-2 times a day. This needs to be taken with extra water. This can take 1 to 3 days for a bowel movement change to be noticed. I suspect your bloating is secondary to constipation which is being worsened by the suboxone.

## 2019-07-15 NOTE — ED Triage Notes (Signed)
Patient c/o mid abdominal pain/umbilical pain since 0200 today. Patient states she has a knot in her "belly button". Patient states she has had an [place] and CT scan last year and can not find anything wrong. Patient denies any N/v/D.

## 2019-08-03 ENCOUNTER — Ambulatory Visit: Payer: Self-pay | Admitting: Surgery

## 2019-08-03 ENCOUNTER — Encounter: Payer: Self-pay | Admitting: Surgery

## 2019-08-03 DIAGNOSIS — M6208 Separation of muscle (nontraumatic), other site: Secondary | ICD-10-CM | POA: Insufficient documentation

## 2019-08-03 DIAGNOSIS — K430 Incisional hernia with obstruction, without gangrene: Secondary | ICD-10-CM | POA: Insufficient documentation

## 2019-08-03 NOTE — H&P (Signed)
[name] Documented: 08/03/2019 9:36 AM Location: [facility] Patient #: [id number] DOB: [birth year] Married / Language: [place] / Race: White Female  History of Present Illness Jessica Sportsman MD; 08/03/2019 10:17 AM) The patient is a 34 year old female who presents with an incisional hernia. Note for "Incisional hernia": ` ` ` Patient sent for surgical consultation at the request of [name], [place] ED  Chief Complaint: Periumbilical swelling in hernia. ` ` The patient is a active woman. Required cholecystectomy 2016 for chronic cholecystitis with large stone. 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 His had some intermittent pains. Left upper quadrant 2018 with negative workup. Upper abdomen in 2019 with ultrasound showing questionable lipoma but no hernia. Was pregnant in 2018 with bulging. Periumbilical discomfort but no definite hernia. Had CT scan done in [facility] 2020 last year when she had some bloating and abdominal pain with no hernia. She noticed sharp pain and pulling when she was lifting at work. Felt a popping sensation. Hernia suspected and confirmed with CAT scan.  Patient used to smoke but quit last month. He does note she had some drainage around her C-section incision 2 years ago but healed otherwise well. She had a tubal ligation with her pregnancy is not planning to get pregnant again. She is otherwise rather physically active. She had some gestational diabetes but no active diabetes now. Usually moves her bowels every day or so. Does occasionally get some bloating with eating which makes her periumbilical hernia pain worse. The smaller meals.  (Review of systems as stated in this history (HPI) or in the review of systems. Otherwise all other 12 point ROS are negative) ` ` `  This patient encounter took 35 minutes today to perform the following: obtain history, perform exam, review outside records, interpret tests &  imaging, counsel the patient on their diagnosis; and, document this encounter, including findings & plan in the electronic health record (EHR).   Past Surgical History [name], CMA; 08/03/2019 9:36 AM) Breast Biopsy Right. Cesarean Section - Multiple Gallbladder Surgery - Laparoscopic [08/10/2014]: lap chole w/single site Oral Surgery  Diagnostic Studies History [name], CMA; 08/03/2019 9:36 AM) Colonoscopy >10 years ago 5-10 years ago Mammogram >3 years ago 1-3 years ago Pap Smear 1-5 years ago  Allergies [name], CMA; 08/03/2019 9:39 AM) Amoxicillin *PENICILLINS* Macrobid *URINARY ANTI-INFECTIVES* Bactrim *ANTI-INFECTIVE AGENTS - MISC.* Allergies Reconciled  Medication History [name], CMA; 08/03/2019 9:38 AM) Ibuprofen (800MG  Tablet, Oral) Active. (as needed) Suboxone (8-2MG  Film, Sublingual) Active. Chantix Starting Month Pak (0.5 MG X 11 &1 MG X 42 Tablet, Oral) Active. Medications Reconciled  Pregnancy / Birth History , [name]; 08/03/2019 9:36 AM) Age at menarche 9 years, 10 years. Gravida 4 1 Irregular periods Maternal age 56-25 Para 2 1  Other Problems 23-34, [name]; 08/03/2019 9:36 AM) Cholelithiasis Umbilical Hernia Repair     Review of Systems 10/03/2019 CMA; 08/03/2019 9:36 AM) General Present- Fatigue. Not Present- Appetite Loss, Chills, Fever, Night Sweats, Weight Gain and Weight Loss. Skin Present- Dryness. Not Present- Change in Wart/Mole, Hives, Jaundice, New Lesions, Non-Healing Wounds, Rash and Ulcer. HEENT Present- Seasonal Allergies. Not Present- Earache, Hearing Loss, Hoarseness, Nose Bleed, Oral Ulcers, Ringing in the Ears, Sinus Pain, Sore Throat, Visual Disturbances, Wears glasses/contact lenses and Yellow Eyes. Respiratory Not Present- Bloody sputum, Chronic Cough, Difficulty Breathing, Snoring and Wheezing. Breast Not Present- Breast Mass, Breast Pain, Nipple  Discharge and Skin Changes. Cardiovascular Present- Palpitations. Not Present- Chest Pain,  Difficulty Breathing Lying Down, Leg Cramps, Rapid Heart Rate, Shortness of Breath and Swelling of Extremities. Gastrointestinal Present- Abdominal Pain, Bloating, Change in Bowel Habits, Constipation, Excessive gas and Gets full quickly at meals. Not Present- Bloody Stool, Chronic diarrhea, Difficulty Swallowing, Hemorrhoids, Indigestion, Nausea, Rectal Pain and Vomiting. Female Genitourinary Not Present- Frequency, Nocturia, Painful Urination, Pelvic Pain and Urgency. Musculoskeletal Present- Back Pain. Not Present- Joint Pain, Joint Stiffness, Muscle Pain, Muscle Weakness and Swelling of Extremities. Neurological Not Present- Decreased Memory, Fainting, Headaches, Numbness, Seizures, Tingling, Tremor, Trouble walking and Weakness. Psychiatric Present- Anxiety. Not Present- Bipolar, Change in Sleep Pattern, Depression, Fearful and Frequent crying. Endocrine Present- Hair Changes. Not Present- Cold Intolerance, Excessive Hunger, Heat Intolerance, Hot flashes and New Diabetes. Hematology Not Present- Blood Thinners, Easy Bruising, Excessive bleeding, Gland problems, HIV and Persistent Infections.  Vitals [name] CMA; 08/03/2019 9:37 AM) 08/03/2019 9:37 AM Weight: 128.4 lb Height: 60in Body Surface Area: 1.55 m Body Mass Index: 25.08 kg/m  Temp.: 98.60F  Pulse: 80 (Regular)  BP: 106/76 (Sitting, Left Arm, Standard)        Physical Exam Jessica Sportsman MD; 08/03/2019 10:15 AM)  General Mental Status-Alert. General Appearance-Not in acute distress, Not Sickly. Orientation-Oriented X3. Hydration-Well hydrated. Voice-Normal.  Integumentary Global Assessment Upon inspection and palpation of skin surfaces of the - Axillae: non-tender, no inflammation or ulceration, no drainage. and Distribution of scalp and body hair is normal. General Characteristics Temperature -  normal warmth is noted.  Head and Neck Head-normocephalic, atraumatic with no lesions or palpable masses. Face Global Assessment - atraumatic, no absence of expression. Neck Global Assessment - no abnormal movements, no bruit auscultated on the right, no bruit auscultated on the left, no decreased range of motion, non-tender. Trachea-midline. Thyroid Gland Characteristics - non-tender.  Eye Eyeball - Left-Extraocular movements intact, No Nystagmus - Left. Eyeball - Right-Extraocular movements intact, No Nystagmus - Right. Cornea - Left-No Hazy - Left. Cornea - Right-No Hazy - Right. Sclera/Conjunctiva - Left-No scleral icterus, No Discharge - Left. Sclera/Conjunctiva - Right-No scleral icterus, No Discharge - Right. Pupil - Left-Direct reaction to light normal. Pupil - Right-Direct reaction to light normal.  ENMT Ears Pinna - Left - no drainage observed, no generalized tenderness observed. Pinna - Right - no drainage observed, no generalized tenderness observed. Nose and Sinuses External Inspection of the Nose - no destructive lesion observed. Inspection of the nares - Left - quiet respiration. Inspection of the nares - Right - quiet respiration. Mouth and Throat Lips - Upper Lip - no fissures observed, no pallor noted. Lower Lip - no fissures observed, no pallor noted. Nasopharynx - no discharge present. Oral Cavity/Oropharynx - Tongue - no dryness observed. Oral Mucosa - no cyanosis observed. Hypopharynx - no evidence of airway distress observed.  Chest and Lung Exam Inspection Movements - Normal and Symmetrical. Accessory muscles - No use of accessory muscles in breathing. Palpation Palpation of the chest reveals - Non-tender. Auscultation Breath sounds - Normal and Clear.  Cardiovascular Auscultation Rhythm - Regular. Murmurs & Other Heart Sounds - Auscultation of the heart reveals - No Murmurs and No Systolic Clicks.  Abdomen Inspection Inspection  of the abdomen reveals - No Visible peristalsis and No Abnormal pulsations. Umbilicus - No Bleeding, No Urine drainage. Palpation/Percussion Palpation and Percussion of the abdomen reveal - Soft, Non Tender, No Rebound tenderness, No Rigidity (guarding) and No Cutaneous hyperesthesia. Note: Abdomen soft. Mild diastases recti. 3 x 2 cm periumbilical mass consistent with incarcerated hernia. Mildly sensitive. No  evidence of any ischemia or strangulation or cellulitis. Not severely distended.   Female Genitourinary Sexual Maturity Tanner 5 - Adult hair pattern. Note: Pfannenstiel incision well healed. No pain in still or inguinal hernias. No inguinal lymphadenopathy. No vaginal bleeding nor discharge  Peripheral Vascular Upper Extremity Inspection - Left - No Cyanotic nailbeds - Left, Not Ischemic. Inspection - Right - No Cyanotic nailbeds - Right, Not Ischemic.  Neurologic Neurologic evaluation reveals -normal attention span and ability to concentrate, able to name objects and repeat phrases. Appropriate fund of knowledge , normal sensation and normal coordination. Mental Status Affect - not angry, not paranoid. Cranial Nerves-Normal Bilaterally. Gait-Normal.  Neuropsychiatric Mental status exam performed with findings of-able to articulate well with normal speech/language, rate, volume and coherence, thought content normal with ability to perform basic computations and apply abstract reasoning and no evidence of hallucinations, delusions, obsessions or homicidal/suicidal ideation.  Musculoskeletal Global Assessment Spine, Ribs and Pelvis - no instability, subluxation or laxity. Right Upper Extremity - no instability, subluxation or laxity.  Lymphatic Head & Neck  General Head & Neck Lymphatics: Bilateral - Description - No Localized lymphadenopathy. Axillary  General Axillary Region: Bilateral - Description - No Localized lymphadenopathy. Femoral &  Inguinal  Generalized Femoral & Inguinal Lymphatics: Left - Description - No Localized lymphadenopathy. Right - Description - No Localized lymphadenopathy.    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 No evidence year ago.  I think she would benefit from surgical repair. Laparoscopic approach. I think she just has diastases supraumbilically but that would allow to make sure she does not have a suprapubic umbilical/epigastric hernia as well. I would do mesh repair since she failed with adequate suturing.  The fact that she is quit smoking, we'll no longer get pregnant, and is trying to transition away from heavy lifting or hopeful signs that we can minimize recurrence.   PREOP - [name] - ENCOUNTER FOR PREOPERATIVE EXAMINATION FOR GENERAL SURGICAL PROCEDURE (Z01.818)  Current Plans You are being scheduled for surgery- Our schedulers will call you.  You should hear from our office's scheduling department within 5 working days about the location, date, and time of surgery. We try to make accommodations for patient's preferences in scheduling surgery, but sometimes the OR schedule or the surgeon's schedule prevents [place] from making those accommodations.  If you have not heard from our office [phone]) in 5 working days, call the office and ask for your surgeon's nurse.  If you have other questions about your diagnosis, plan, or surgery, call the office and ask for your surgeon's nurse.  Written instructions provided [facility] Consent - Hernia Repair - Ventral/Incisional/Umbilical (Ciearra Rufo): discussed with patient and provided information. Pt Education - [facility] Hernia Post-Op HCI (Livan Hires): discussed with patient and provided information. Pt Education - [facility] Pain Control (Deantre Bourdon) Pt Education - Pamphlet Given - Laparoscopic Hernia Repair:  discussed with patient and provided information. Pt Education - [facility] Mesh education: discussed with patient and provided information.  DIASTASIS RECTI (M62.08)  Current Plans Pt Education - [facility] Diastasis Recti: discussed with patient and provided information.  Jessica Hector, MD, FACS, MASCRS Gastrointestinal and Minimally Invasive Surgery  [facility] [address] [place], [place] [address] [phone] Main / Paging [phone] Fax

## 2019-10-03 NOTE — Patient Instructions (Addendum)
DUE TO COVID-19 ONLY ONE VISITOR IS ALLOWED TO COME WITH YOU AND STAY IN THE WAITING ROOM ONLY DURING PRE OP AND PROCEDURE DAY OF SURGERY. THE 2 VISITOR MAY VISIT WITH YOU AFTER SURGERY IN YOUR PRIVATE ROOM DURING VISITING HOURS ONLY!  YOU NEED TO HAVE A COVID 19 TEST ON_5/17______ @_9 :05______, THIS TEST MUST BE DONE BEFORE SURGERY, COME  [address] [place] , [address].  ([place]) ONCE YOUR COVID TEST IS COMPLETED, PLEASE BEGIN THE QUARANTINE INSTRUCTIONS AS OUTLINED IN YOUR HANDOUT.                Carley Hammed    Your procedure is scheduled on: 10/13/19   Report to [facility] Main  Entrance   Report to admitting at  9:00 AM     Call this number if you have problems the morning of surgery [place], NO CHEWING GUM [place].    Do not eat food After Midnight.   YOU MAY HAVE CLEAR LIQUIDS FROM MIDNIGHT UNTIL 8:00 AM.   At 8:00 AM Please finish the prescribed Pre-Surgery  drink  . Nothing by mouth after you finish the  drink !   Take these medicines the morning of surgery with A SIP OF WATER:  None                                 You may not have any metal on your body including hair pins and              piercings  Do not wear jewelry, make-up, lotions, powders or perfumes, deodorant             Do not wear nail polish on your fingernails.  Do not shave  48 hours prior to surgery.                Do not bring valuables to the hospital. [place].  Contacts, dentures or bridgework may not be worn into surgery.  .     Patients discharged the day of surgery will not be allowed to drive home.   IF YOU ARE HAVING SURGERY AND GOING HOME THE SAME DAY, YOU MUST HAVE AN ADULT TO DRIVE YOU HOME AND BE WITH YOU FOR 24 HOURS.   YOU MAY GO HOME BY TAXI OR UBER OR ORTHERWISE, BUT AN ADULT MUST ACCOMPANY YOU HOME AND STAY WITH  YOU FOR 24 HOURS.  Name and phone number of your driver:  Special Instructions: N/A              Please read over the following fact sheets you were given: _____________________________________________________________________             [facility] - Preparing for Surgery Before surgery, you can play an important role .  Because skin is not sterile, your skin needs to be as free of germs as possible.   You can reduce the number of germs on your skin by washing with CHG (chlorahexidine gluconate) soap before surgery .  CHG is an antiseptic cleaner which kills germs and bonds with the skin to continue killing germs even after washing. Please DO NOT use if you have an allergy to CHG or antibacterial soaps .  If your skin becomes reddened/irritated stop using the CHG and inform your nurse when you arrive at Short Stay. Do not shave (including legs and underarms) for at least 48 hours prior to the first CHG shower. . Please follow these instructions carefully:  1.  Shower with CHG Soap the night before surgery and the  morning of Surgery.  2.  If you choose to wash your hair, wash your hair first as usual with your  normal  shampoo.  3.  After you shampoo, rinse your hair and body thoroughly to remove the  shampoo.                                        4.  Use CHG as you would any other liquid soap.  You can apply chg directly  to the skin and wash                       Gently with a scrungie or clean washcloth.  5.  Apply the CHG Soap to your body ONLY FROM THE NECK DOWN.   Do not use on face/ open                           Wound or open sores. Avoid contact with eyes, ears mouth and genitals (private parts).                       Wash face,  Genitals (private parts) with your normal soap.             6.  Wash thoroughly, paying special attention to the area where your surgery  will be performed.  7.  Thoroughly rinse your body with warm water from the neck down.  8.  DO NOT shower/wash  with your normal soap after using and rinsing off  the CHG Soap.             9.  Pat yourself dry with a clean towel.            10.  Wear clean pajamas.            11.  Place clean sheets on your bed the night of your first shower and do not  sleep with pets. Day of Surgery : Do not apply any lotions/deodorants the morning of surgery.  Please wear clean clothes to the hospital/surgery center.  FAILURE TO FOLLOW THESE INSTRUCTIONS MAY RESULT IN THE CANCELLATION OF YOUR SURGERY PATIENT SIGNATURE_________________________________  NURSE SIGNATURE__________________________________  ________________________________________________________________________

## 2019-10-06 ENCOUNTER — Encounter (HOSPITAL_COMMUNITY)
Admission: RE | Admit: 2019-10-06 | Discharge: 2019-10-06 | Disposition: A | Discharge status: Home / Self Care | Payer: Commercial Managed Care - PPO | Source: Ambulatory Visit | Attending: Surgery | Admitting: Surgery

## 2019-10-06 ENCOUNTER — Encounter (HOSPITAL_COMMUNITY): Payer: Self-pay

## 2019-10-06 ENCOUNTER — Other Ambulatory Visit: Payer: Self-pay

## 2019-10-06 DIAGNOSIS — Z01818 Encounter for other preprocedural examination: Secondary | ICD-10-CM | POA: Insufficient documentation

## 2019-10-06 DIAGNOSIS — I4892 Unspecified atrial flutter: Secondary | ICD-10-CM | POA: Diagnosis not present

## 2019-10-06 HISTORY — DX: Other specified postprocedural states: R11.2

## 2019-10-06 HISTORY — DX: Other specified postprocedural states: Z98.890

## 2019-10-06 HISTORY — DX: Family history of other specified conditions: Z84.89

## 2019-10-06 HISTORY — DX: Opioid abuse, uncomplicated: F11.10

## 2019-10-06 LAB — CBC
HCT: 37.3 % (ref 36.0–46.0)
Hemoglobin: 12.1 g/dL (ref 12.0–15.0)
MCH: 30.7 pg (ref 26.0–34.0)
MCHC: 32.4 g/dL (ref 30.0–36.0)
MCV: 94.7 fL (ref 80.0–100.0)
Platelets: 247 10*3/uL (ref 150–400)
RBC: 3.94 MIL/uL (ref 3.87–5.11)
RDW: 12.1 % (ref 11.5–15.5)
WBC: 6.2 10*3/uL (ref 4.0–10.5)
nRBC: 0 % (ref 0.0–0.2)

## 2019-10-06 LAB — COMPREHENSIVE METABOLIC PANEL
ALT: 23 U/L (ref 0–44)
AST: 23 U/L (ref 15–41)
Albumin: 4.5 g/dL (ref 3.5–5.0)
Alkaline Phosphatase: 53 U/L (ref 38–126)
Anion gap: 7 (ref 5–15)
BUN: 9 mg/dL (ref 6–20)
CO2: 27 mmol/L (ref 22–32)
Calcium: 9.1 mg/dL (ref 8.9–10.3)
Chloride: 106 mmol/L (ref 98–111)
Creatinine, Ser: 0.6 mg/dL (ref 0.44–1.00)
GFR calc Af Amer: >60 mL/min (ref >60)
GFR calc non Af Amer: >60 mL/min (ref >60)
Glucose, Bld: 72 mg/dL (ref 70–99)
Potassium: 4 mmol/L (ref 3.5–5.1)
Sodium: 140 mmol/L (ref 135–145)
Total Bilirubin: 0.6 mg/dL (ref 0.3–1.2)
Total Protein: 7.2 g/dL (ref 6.5–8.1)

## 2019-10-06 NOTE — Progress Notes (Signed)
PCP - Chiquita Loth PA Cardiologist - no  Chest x-ray - no EKG - 10/06/19 Stress Test - no ECHO - no Cardiac Cath - no  Sleep Study - no CPAP -   Fasting Blood Sugar - NA Checks Blood Sugar _____ times a day  Blood Thinner Instructions:NA Aspirin Instructions: Last Dose:  Anesthesia review:   Patient denies shortness of breath, fever, cough and chest pain at PAT appointment yes  Patient verbalized understanding of instructions that were given to them at the PAT appointment. Patient was also instructed that they will need to review over the PAT instructions again at home before surgery. Yes Pt reports an intermittent "fluttering feeling in chest" for about a year. Her PCP prescribed anxiety medication but she is reluctant to take it because she takes Suboxone.Marland Kitchen

## 2019-10-10 ENCOUNTER — Other Ambulatory Visit (HOSPITAL_COMMUNITY)
Admission: RE | Admit: 2019-10-10 | Discharge: 2019-10-10 | Disposition: A | Discharge status: Home / Self Care | Payer: Commercial Managed Care - PPO | Source: Ambulatory Visit | Attending: Surgery | Admitting: Surgery

## 2019-10-10 DIAGNOSIS — Z20822 Contact with and (suspected) exposure to covid-19: Secondary | ICD-10-CM | POA: Diagnosis not present

## 2019-10-10 DIAGNOSIS — Z01812 Encounter for preprocedural laboratory examination: Secondary | ICD-10-CM | POA: Insufficient documentation

## 2019-10-10 LAB — SARS CORONAVIRUS 2 (TAT 6-24 HRS): SARS Coronavirus 2: NEGATIVE

## 2019-10-12 MED ORDER — GENTAMICIN SULFATE 40 MG/ML IJ SOLN
5.0000 mg/kg | INTRAVENOUS | Status: AC
  Administered 2019-10-13: 300 mg via INTRAVENOUS
  Filled 2019-10-12: qty 7.5

## 2019-10-12 MED ORDER — CLINDAMYCIN PHOSPHATE 900 MG/50ML IV SOLN
900.0000 mg | INTRAVENOUS | Status: AC
  Administered 2019-10-13: 900 mg via INTRAVENOUS
  Filled 2019-10-12: qty 50

## 2019-10-12 MED ORDER — BUPIVACAINE LIPOSOME 1.3 % IJ SUSP
20.0000 mL | INTRAMUSCULAR | Status: DC
  Filled 2019-10-12: qty 20

## 2019-10-13 ENCOUNTER — Other Ambulatory Visit: Payer: Self-pay

## 2019-10-13 ENCOUNTER — Ambulatory Visit (HOSPITAL_COMMUNITY)
Admission: RE | Admit: 2019-10-13 | Discharge: 2019-10-13 | Disposition: A | Discharge status: Home / Self Care | Payer: Commercial Managed Care - PPO | Attending: Surgery | Admitting: Surgery

## 2019-10-13 ENCOUNTER — Ambulatory Visit (HOSPITAL_COMMUNITY): Payer: Commercial Managed Care - PPO | Admitting: Physician Assistant

## 2019-10-13 ENCOUNTER — Encounter (HOSPITAL_COMMUNITY): Payer: Self-pay | Admitting: Surgery

## 2019-10-13 ENCOUNTER — Encounter (HOSPITAL_COMMUNITY)
Admission: RE | Disposition: A | Discharge status: Home / Self Care | Payer: Self-pay | Source: Home / Self Care | Attending: Surgery

## 2019-10-13 ENCOUNTER — Ambulatory Visit (HOSPITAL_COMMUNITY): Payer: Commercial Managed Care - PPO | Admitting: Anesthesiology

## 2019-10-13 DIAGNOSIS — R87619 Unspecified abnormal cytological findings in specimens from cervix uteri: Secondary | ICD-10-CM | POA: Insufficient documentation

## 2019-10-13 DIAGNOSIS — Z87891 Personal history of nicotine dependence: Secondary | ICD-10-CM | POA: Insufficient documentation

## 2019-10-13 DIAGNOSIS — K5900 Constipation, unspecified: Secondary | ICD-10-CM | POA: Diagnosis not present

## 2019-10-13 DIAGNOSIS — Z79899 Other long term (current) drug therapy: Secondary | ICD-10-CM | POA: Insufficient documentation

## 2019-10-13 DIAGNOSIS — Z880 Allergy status to penicillin: Secondary | ICD-10-CM | POA: Insufficient documentation

## 2019-10-13 DIAGNOSIS — N926 Irregular menstruation, unspecified: Secondary | ICD-10-CM | POA: Insufficient documentation

## 2019-10-13 DIAGNOSIS — R109 Unspecified abdominal pain: Secondary | ICD-10-CM | POA: Insufficient documentation

## 2019-10-13 DIAGNOSIS — Z8632 Personal history of gestational diabetes: Secondary | ICD-10-CM | POA: Insufficient documentation

## 2019-10-13 DIAGNOSIS — F419 Anxiety disorder, unspecified: Secondary | ICD-10-CM | POA: Insufficient documentation

## 2019-10-13 DIAGNOSIS — K430 Incisional hernia with obstruction, without gangrene: Secondary | ICD-10-CM | POA: Insufficient documentation

## 2019-10-13 DIAGNOSIS — Z9851 Tubal ligation status: Secondary | ICD-10-CM | POA: Insufficient documentation

## 2019-10-13 DIAGNOSIS — Z9049 Acquired absence of other specified parts of digestive tract: Secondary | ICD-10-CM | POA: Diagnosis not present

## 2019-10-13 DIAGNOSIS — M6208 Separation of muscle (nontraumatic), other site: Secondary | ICD-10-CM | POA: Diagnosis not present

## 2019-10-13 DIAGNOSIS — R102 Pelvic and perineal pain: Secondary | ICD-10-CM | POA: Insufficient documentation

## 2019-10-13 DIAGNOSIS — Z881 Allergy status to other antibiotic agents status: Secondary | ICD-10-CM | POA: Insufficient documentation

## 2019-10-13 DIAGNOSIS — F1120 Opioid dependence, uncomplicated: Secondary | ICD-10-CM

## 2019-10-13 HISTORY — PX: VENTRAL HERNIA REPAIR: SHX424

## 2019-10-13 LAB — PREGNANCY, URINE: Preg Test, Ur: NEGATIVE

## 2019-10-13 SURGERY — REPAIR, HERNIA, VENTRAL, LAPAROSCOPIC
Anesthesia: General

## 2019-10-13 MED ORDER — ONDANSETRON HCL 4 MG/2ML IJ SOLN
INTRAMUSCULAR | Status: AC
  Filled 2019-10-13: qty 2

## 2019-10-13 MED ORDER — PROPOFOL 10 MG/ML IV BOLUS
INTRAVENOUS | Status: AC
  Filled 2019-10-13: qty 20

## 2019-10-13 MED ORDER — FENTANYL CITRATE (PF) 250 MCG/5ML IJ SOLN
INTRAMUSCULAR | Status: AC
  Filled 2019-10-13: qty 5

## 2019-10-13 MED ORDER — METHOCARBAMOL 750 MG PO TABS: 750.0000 mg | ORAL_TABLET | Freq: Four times a day (QID) | ORAL | 2 refills | Status: DC | PRN

## 2019-10-13 MED ORDER — SCOPOLAMINE 1 MG/3DAYS TD PT72
1.0000 | MEDICATED_PATCH | TRANSDERMAL | Status: DC
  Filled 2019-10-13: qty 1

## 2019-10-13 MED ORDER — LIDOCAINE 2% (20 MG/ML) 5 ML SYRINGE
INTRAMUSCULAR | Status: DC | PRN
Start: 2019-10-13 — End: 2019-10-13
  Administered 2019-10-13: 1.5 mg/kg/h via INTRAVENOUS

## 2019-10-13 MED ORDER — CHLORHEXIDINE GLUCONATE CLOTH 2 % EX PADS: 6.0000 | MEDICATED_PAD | Freq: Once | CUTANEOUS | Status: DC

## 2019-10-13 MED ORDER — DEXAMETHASONE SODIUM PHOSPHATE 10 MG/ML IJ SOLN
INTRAMUSCULAR | Status: DC | PRN
  Administered 2019-10-13: 10 mg via INTRAVENOUS

## 2019-10-13 MED ORDER — FENTANYL CITRATE (PF) 100 MCG/2ML IJ SOLN
INTRAMUSCULAR | Status: AC
  Filled 2019-10-13: qty 2

## 2019-10-13 MED ORDER — 0.9 % SODIUM CHLORIDE (POUR BTL) OPTIME
TOPICAL | Status: DC | PRN
  Administered 2019-10-13: 1000 mL

## 2019-10-13 MED ORDER — CELECOXIB 200 MG PO CAPS
200.0000 mg | ORAL_CAPSULE | ORAL | Status: AC
  Administered 2019-10-13: 200 mg via ORAL
  Filled 2019-10-13: qty 1

## 2019-10-13 MED ORDER — HYDROCODONE-ACETAMINOPHEN 10-325 MG PO TABS: 1.0000 | ORAL_TABLET | Freq: Four times a day (QID) | ORAL | 0 refills | Status: DC | PRN

## 2019-10-13 MED ORDER — MIDAZOLAM HCL 2 MG/2ML IJ SOLN
INTRAMUSCULAR | Status: AC
  Filled 2019-10-13: qty 2

## 2019-10-13 MED ORDER — LACTATED RINGERS IV SOLN: INTRAVENOUS | Status: DC

## 2019-10-13 MED ORDER — STERILE WATER FOR IRRIGATION IR SOLN
Status: DC | PRN
  Administered 2019-10-13: 1000 mL

## 2019-10-13 MED ORDER — KETAMINE HCL 10 MG/ML IJ SOLN
INTRAMUSCULAR | Status: AC
  Filled 2019-10-13: qty 1

## 2019-10-13 MED ORDER — LIDOCAINE HCL 2 % IJ SOLN
INTRAMUSCULAR | Status: AC
  Filled 2019-10-13: qty 20

## 2019-10-13 MED ORDER — ONDANSETRON HCL 4 MG/2ML IJ SOLN
INTRAMUSCULAR | Status: DC | PRN
  Administered 2019-10-13: 4 mg via INTRAVENOUS

## 2019-10-13 MED ORDER — ENSURE PRE-SURGERY PO LIQD
296.0000 mL | ORAL | Status: DC
  Filled 2019-10-13: qty 296

## 2019-10-13 MED ORDER — CHLORHEXIDINE GLUCONATE 0.12 % MT SOLN
15.0000 mL | Freq: Once | OROMUCOSAL | Status: AC
  Administered 2019-10-13: 15 mL via OROMUCOSAL

## 2019-10-13 MED ORDER — BUPIVACAINE HCL 0.25 % IJ SOLN
INTRAMUSCULAR | Status: AC
  Filled 2019-10-13: qty 1

## 2019-10-13 MED ORDER — ACETAMINOPHEN 500 MG PO TABS
1000.0000 mg | ORAL_TABLET | ORAL | Status: AC
  Administered 2019-10-13: 1000 mg via ORAL
  Filled 2019-10-13: qty 2

## 2019-10-13 MED ORDER — LIDOCAINE 2% (20 MG/ML) 5 ML SYRINGE
INTRAMUSCULAR | Status: AC
  Filled 2019-10-13: qty 5

## 2019-10-13 MED ORDER — ROCURONIUM BROMIDE 10 MG/ML (PF) SYRINGE
PREFILLED_SYRINGE | INTRAVENOUS | Status: DC | PRN
  Administered 2019-10-13: 60 mg via INTRAVENOUS

## 2019-10-13 MED ORDER — BUPIVACAINE LIPOSOME 1.3 % IJ SUSP
INTRAMUSCULAR | Status: DC | PRN
  Administered 2019-10-13: 20 mL

## 2019-10-13 MED ORDER — SCOPOLAMINE 1 MG/3DAYS TD PT72
MEDICATED_PATCH | TRANSDERMAL | Status: AC
  Administered 2019-10-13: 1.5 mg via TRANSDERMAL
  Filled 2019-10-13: qty 1

## 2019-10-13 MED ORDER — FENTANYL CITRATE (PF) 100 MCG/2ML IJ SOLN
25.0000 ug | INTRAMUSCULAR | Status: DC | PRN
  Administered 2019-10-13: 50 ug via INTRAVENOUS
  Administered 2019-10-13: 25 ug via INTRAVENOUS
  Administered 2019-10-13: 50 ug via INTRAVENOUS

## 2019-10-13 MED ORDER — BUPIVACAINE-EPINEPHRINE 0.25% -1:200000 IJ SOLN
INTRAMUSCULAR | Status: DC | PRN
  Administered 2019-10-13: 30 mL

## 2019-10-13 MED ORDER — PROMETHAZINE HCL 25 MG/ML IJ SOLN: 6.2500 mg | INTRAMUSCULAR | Status: DC | PRN

## 2019-10-13 MED ORDER — GABAPENTIN 300 MG PO CAPS
300.0000 mg | ORAL_CAPSULE | ORAL | Status: AC
  Administered 2019-10-13: 300 mg via ORAL
  Filled 2019-10-13: qty 1

## 2019-10-13 MED ORDER — DEXAMETHASONE SODIUM PHOSPHATE 10 MG/ML IJ SOLN
INTRAMUSCULAR | Status: AC
  Filled 2019-10-13: qty 1

## 2019-10-13 MED ORDER — PROPOFOL 10 MG/ML IV BOLUS
INTRAVENOUS | Status: DC | PRN
  Administered 2019-10-13: 200 mg via INTRAVENOUS

## 2019-10-13 MED ORDER — LIDOCAINE 2% (20 MG/ML) 5 ML SYRINGE
INTRAMUSCULAR | Status: DC | PRN
  Administered 2019-10-13: 60 mg via INTRAVENOUS

## 2019-10-13 MED ORDER — KETAMINE HCL 10 MG/ML IJ SOLN
INTRAMUSCULAR | Status: DC | PRN
  Administered 2019-10-13 (×3): 10 mg via INTRAVENOUS
  Administered 2019-10-13: 30 mg via INTRAVENOUS

## 2019-10-13 MED ORDER — FENTANYL CITRATE (PF) 250 MCG/5ML IJ SOLN
INTRAMUSCULAR | Status: DC | PRN
  Administered 2019-10-13: 100 ug via INTRAVENOUS
  Administered 2019-10-13 (×3): 50 ug via INTRAVENOUS

## 2019-10-13 MED ORDER — SUGAMMADEX SODIUM 200 MG/2ML IV SOLN
INTRAVENOUS | Status: DC | PRN
  Administered 2019-10-13: 130 mg via INTRAVENOUS

## 2019-10-13 MED ORDER — MIDAZOLAM HCL 2 MG/2ML IJ SOLN
INTRAMUSCULAR | Status: DC | PRN
  Administered 2019-10-13: 1 mg via INTRAVENOUS

## 2019-10-13 SURGICAL SUPPLY — 44 items
APPLIER CLIP 5 13 M/L LIGAMAX5 (MISCELLANEOUS)
BINDER ABDOMINAL 12 ML 46-62 (SOFTGOODS) ×3 IMPLANT
CABLE HIGH FREQUENCY MONO STRZ (ELECTRODE) ×3 IMPLANT
CHLORAPREP W/TINT 26 (MISCELLANEOUS) ×3 IMPLANT
CLIP APPLIE 5 13 M/L LIGAMAX5 (MISCELLANEOUS) IMPLANT
CLOSURE WOUND 1/2 X4 (GAUZE/BANDAGES/DRESSINGS) ×2
COVER SURGICAL LIGHT HANDLE (MISCELLANEOUS) ×3 IMPLANT
COVER WAND RF STERILE (DRAPES) ×3 IMPLANT
DECANTER SPIKE VIAL GLASS SM (MISCELLANEOUS) ×3 IMPLANT
DEVICE SECURE STRAP 25 ABSORB (INSTRUMENTS) IMPLANT
DEVICE TROCAR PUNCTURE CLOSURE (ENDOMECHANICALS) ×3 IMPLANT
DRAPE WARM FLUID 44X44 (DRAPES) ×3 IMPLANT
DRSG TEGADERM 2-3/8X2-3/4 SM (GAUZE/BANDAGES/DRESSINGS) ×3 IMPLANT
DRSG TEGADERM 4X4.75 (GAUZE/BANDAGES/DRESSINGS) ×3 IMPLANT
ELECT REM PT RETURN 15FT ADLT (MISCELLANEOUS) ×3 IMPLANT
GAUZE SPONGE 2X2 8PLY STRL LF (GAUZE/BANDAGES/DRESSINGS) IMPLANT
GLOVE ECLIPSE 8.0 STRL XLNG CF (GLOVE) ×3 IMPLANT
GLOVE INDICATOR 8.0 STRL GRN (GLOVE) ×3 IMPLANT
GOWN STRL REUS W/TWL XL LVL3 (GOWN DISPOSABLE) ×6 IMPLANT
IRRIG SUCT STRYKERFLOW 2 WTIP (MISCELLANEOUS)
IRRIGATION SUCT STRKRFLW 2 WTP (MISCELLANEOUS) IMPLANT
KIT BASIN (CUSTOM PROCEDURE TRAY) ×3 IMPLANT
KIT TURNOVER KIT A (KITS) IMPLANT
MARKER SKIN DUAL TIP RULER LAB (MISCELLANEOUS) ×3 IMPLANT
MESH VENTRALIGHT ST 6X8 (Mesh Specialty) ×3 IMPLANT
MESH VENTRLGHT ELLIPSE 8X6XMFL (Mesh Specialty) ×1 IMPLANT
NEEDLE INSUFFLATION 14GA 120MM (NEEDLE) ×3 IMPLANT
NEEDLE SPNL 22GX3.5 QUINCKE BK (NEEDLE) IMPLANT
PAD POSITIONING PINK XL (MISCELLANEOUS) ×3 IMPLANT
PENCIL SMOKE EVACUATOR (MISCELLANEOUS) IMPLANT
SCISSORS LAP 5X35 DISP (ENDOMECHANICALS) ×3 IMPLANT
SET TUBE SMOKE EVAC HIGH FLOW (TUBING) ×3 IMPLANT
SLEEVE ADV FIXATION 5X100MM (TROCAR) ×6 IMPLANT
SPONGE GAUZE 2X2 STER 10/PKG (GAUZE/BANDAGES/DRESSINGS)
STRIP CLOSURE SKIN 1/2X4 (GAUZE/BANDAGES/DRESSINGS) ×4 IMPLANT
SUT MNCRL AB 4-0 PS2 18 (SUTURE) ×3 IMPLANT
SUT PDS AB 1 CT1 27 (SUTURE) ×6 IMPLANT
SUT PROLENE 1 CT 1 30 (SUTURE) ×18 IMPLANT
TOWEL OR 17X26 10 PK STRL BLUE (TOWEL DISPOSABLE) ×3 IMPLANT
TRAY CATH 16FR W/PLASTIC CATH (SET/KITS/TRAYS/PACK) ×3 IMPLANT
TRAY LAPAROSCOPIC (CUSTOM PROCEDURE TRAY) ×3 IMPLANT
TROCAR ADV FIXATION 11X100MM (TROCAR) IMPLANT
TROCAR ADV FIXATION 5X100MM (TROCAR) ×3 IMPLANT
TROCAR BLADELESS OPT 5 100 (ENDOMECHANICALS) ×3 IMPLANT

## 2019-10-13 NOTE — Anesthesia Procedure Notes (Signed)
Procedure Name: Intubation Date/Time: 10/13/2019 11:42 AM Performed by: Florene Route, CRNA Patient Re-evaluated:Patient Re-evaluated prior to induction Oxygen Delivery Method: Circle system utilized Preoxygenation: Pre-oxygenation with 100% oxygen Induction Type: IV induction Ventilation: Mask ventilation without difficulty and Oral airway inserted - appropriate to patient size Laryngoscope Size: Hyacinth Meeker and 2 Grade View: Grade I Tube type: Oral Tube size: 7.0 mm Number of attempts: 1 Airway Equipment and Method: Stylet Placement Confirmation: ETT inserted through vocal cords under direct vision,  positive ETCO2 and breath sounds checked- equal and bilateral Secured at: 20 cm Tube secured with: Tape Dental Injury: Teeth and Oropharynx as per pre-operative assessment

## 2019-10-13 NOTE — Transfer of Care (Signed)
Immediate Anesthesia Transfer of Care Note  Patient: [name]  Procedure(s) Performed: LAPAROSCOPIC INCISIONAL HERNIA , LAPROSCOPIC HERNIA REPAIR WITH MESH, TAP BLOCK (N/A )  Patient Location: PACU  Anesthesia Type:General  Level of Consciousness: drowsy  Airway & Oxygen Therapy: Patient Spontanous Breathing and Patient connected to face mask oxygen  Post-op Assessment: Report given to RN and Post -op Vital signs reviewed and stable  Post vital signs: Reviewed and stable  Last Vitals:  Vitals Value Taken Time  BP 138/75 10/13/19 1338  Temp    Pulse 78 10/13/19 1339  Resp 20 10/13/19 1339  SpO2 100 % 10/13/19 1339  Vitals shown include unvalidated device data.  Last Pain:  Vitals:   10/13/19 0927  TempSrc: Oral      Patients Stated Pain Goal: 4 (10/13/19 0953)  Complications: No apparent anesthesia complications

## 2019-10-13 NOTE — Discharge Instructions (Signed)
HERNIA REPAIR: POST OP INSTRUCTIONS ° °###################################################################### ° °EAT °Gradually transition to a high fiber diet with a fiber supplement over the next few weeks after discharge.  Start with a pureed / full liquid diet (see below) ° °WALK °Walk an hour a day.  Control your pain to do that.   ° °CONTROL PAIN °Control pain so that you can walk, sleep, tolerate sneezing/coughing, and go up/down stairs. ° °HAVE A BOWEL MOVEMENT DAILY °Keep your bowels regular to avoid problems.  OK to try a laxative to override constipation.  OK to use an antidairrheal to slow down diarrhea.  Call if not better after 2 tries ° °CALL IF YOU HAVE PROBLEMS/CONCERNS °Call if you are still struggling despite following these instructions. °Call if you have concerns not answered by these instructions ° °###################################################################### ° ° ° °1. DIET: Follow a light bland diet & liquids the first 24 hours after arrival home, such as soup, liquids, starches, etc.  Be sure to drink plenty of fluids.  Quickly advance to a usual solid diet within a few days.  Avoid fast food or heavy meals as your are more likely to get nauseated or have irregular bowels.  A low-fat, high-fiber diet for the rest of your life is ideal.  ° °2. Take your usually prescribed home medications unless otherwise directed. ° °3. PAIN CONTROL: °a. Pain is best controlled by a usual combination of three different methods TOGETHER: °i. Ice/Heat °ii. Over the counter pain medication °iii. Prescription pain medication °b. Most patients will experience some swelling and bruising around the hernia(s) such as the bellybutton, groins, or old incisions.  Ice packs or heating pads (30-60 minutes up to 6 times a day) will help. Use ice for the first few days to help decrease swelling and bruising, then switch to heat to help relax tight/sore spots and speed recovery.  Some people prefer to use ice  alone, heat alone, alternating between ice & heat.  Experiment to what works for you.  Swelling and bruising can take several weeks to resolve.   °c. It is helpful to take an over-the-counter pain medication regularly for the first few weeks.  Choose one of the following that works best for you: °i. Naproxen (Aleve, etc)  Two 220mg tabs twice a day °ii. Ibuprofen (Advil, etc) Three 200mg tabs four times a day (every meal & bedtime) °iii. Acetaminophen (Tylenol, etc) 325-650mg four times a day (every meal & bedtime) °d. A  prescription for pain medication should be given to you upon discharge.  Take your pain medication as prescribed.  °i. If you are having problems/concerns with the prescription medicine (does not control pain, nausea, vomiting, rash, itching, etc), please call us [phone] to see if we need to switch you to a different pain medicine that will work better for you and/or control your side effect better. °ii. If you need a refill on your pain medication, please contact your pharmacy.  They will contact our office to request authorization. Prescriptions will not be filled after 5 pm or on week-ends. ° °4. Avoid getting constipated.  Between the surgery and the pain medications, it is common to experience some constipation.  Increasing fluid intake and taking a fiber supplement (such as Metamucil, Citrucel, FiberCon, MiraLax, etc) 1-2 times a day regularly will usually help prevent this problem from occurring.  A mild laxative (prune juice, Milk of Magnesia, MiraLax, etc) should be taken according to package directions if there are no bowel movements after 48   hours.   ° °5. Wash / shower every day.  You may shower over the dressings as they are waterproof.   ° °6. Remove your waterproof bandages, skin tapes, and other bandages 5 days after surgery. You may replace a dressing/Band-Aid to cover the incision for comfort if you wish. You may leave the incisions open to air.  You may replace a  dressing/Band-Aid to cover an incision for comfort if you wish.  Continue to shower over incision(s) after the dressing is off. ° °7. ACTIVITIES as tolerated:   °a. You may resume regular (light) daily activities beginning the next day--such as daily self-care, walking, climbing stairs--gradually increasing activities as tolerated.  Control your pain so that you can walk an hour a day.  If you can walk 30 minutes without difficulty, it is safe to try more intense activity such as jogging, treadmill, bicycling, low-impact aerobics, swimming, etc. °b. Save the most intensive and strenuous activity for last such as sit-ups, heavy lifting, contact sports, etc  Refrain from any heavy lifting or straining until you are off narcotics for pain control.   °c. DO NOT PUSH THROUGH PAIN.  Let pain be your guide: If it hurts to do something, don't do it.  Pain is your body warning you to avoid that activity for another week until the pain goes down. °d. You may drive when you are no longer taking prescription pain medication, you can comfortably wear a seatbelt, and you can safely maneuver your car and apply brakes. °e. You may have sexual intercourse when it is comfortable.  ° °8. FOLLOW UP in our office °a. Please call [facility] at [phone] to set up an appointment to see your surgeon in the office for a follow-up appointment approximately 2-3 weeks after your surgery. °b. Make sure that you call for this appointment the day you arrive home to insure a convenient appointment time. ° °9.  If you have disability of FMLA / Family leave forms, please bring the forms to the office for processing.  (do not give to your surgeon). ° °WHEN TO CALL US [phone]: °1. Poor pain control °2. Reactions / problems with new medications (rash/itching, nausea, etc)  °3. Fever over 101.5 F (38.5 C) °4. Inability to urinate °5. Nausea and/or vomiting °6. Worsening swelling or bruising °7. Continued bleeding from incision. °8. Increased pain,  redness, or drainage from the incision ° ° The clinic staff is available to answer your questions during regular business hours (8:30am-5pm).  Please don’t hesitate to call and ask to speak to one of our nurses for clinical concerns.  ° If you have a medical emergency, go to the nearest emergency room or call 911. ° A surgeon from [facility] is always on call at the hospitals in [place] ° °[facility] °[address], [address], [place], [place]  [address] ? ° [address] [address], [place], [place]   [address] °MAIN: [phone] ? TOLL FREE: [phone] ? FAX: [phone] °[email or web address] ° °

## 2019-10-13 NOTE — Op Note (Signed)
10/13/2019  PATIENT:  [name]  34 y.o. female  Patient Care Team: Chiquita Loth, PA as PCP - General (Physician Assistant) Karie Soda, MD as Consulting Physician (General Surgery)  PRE-OPERATIVE DIAGNOSIS:  INCISIONAL NEW ABDOMINAL WALL HERNIA  POST-OPERATIVE DIAGNOSIS:  INCISIONAL INCARCERATED ABDOMINAL WALL HERNIA  PROCEDURE:  LAPAROSCOPIC REPAIR OF  Incisional Incarcerated abdominal wall hernia HERNIA WITH MESH TAP BLOCK  SURGEON:  [name], MD  ASSISTANT: Nurse   ANESTHESIA:     General  Nerve block provided with liposomal bupivacaine (Experel) mixed with 0.25% bupivacaine as a Bilateral TAP block x 64mL each side at the level of the transverse abdominis & preperitoneal spaces along the flank at the anterior axillary line, from subcostal ridge to iliac crest under laparoscopic guidance   EBL:  Total I/O In: 1000 [I.V.:1000] Out: 550 [Urine:500; Blood:50]  Per anesthesia record  Delay start of Pharmacological VTE agent (>24hrs) due to surgical blood loss or risk of bleeding:  no  DRAINS: none   SPECIMEN:  No Specimen  DISPOSITION OF SPECIMEN:  N/A  COUNTS:  YES  PLAN OF CARE: Discharge to home after PACU  PATIENT DISPOSITION:  PACU - hemodynamically stable.  INDICATION: Pleasant patient has developed a ventral wall abdominal hernia.   Recommendation was made for surgical repair:  The anatomy & physiology of the abdominal wall was discussed. The pathophysiology of hernias was discussed. Natural history risks without surgery including progeressive enlargement, pain, incarceration & strangulation was discussed. Contributors to complications such as smoking, obesity, diabetes, prior surgery, etc were discussed.  I feel the risks of no intervention will lead to serious problems that outweigh the operative risks; therefore, I recommended surgery to reduce and repair the hernia. I explained laparoscopic techniques with possible need for an open  approach. I noted the probable use of mesh to patch and/or buttress the hernia repair  Risks such as bleeding, infection, abscess, need for further treatment, heart attack, death, and other risks were discussed. I noted a good likelihood this will help address the problem. Goals of post-operative recovery were discussed as well. Possibility that this will not correct all symptoms was explained. I stressed the importance of low-impact activity, aggressive pain control, avoiding constipation, & not pushing through pain to minimize risk of post-operative chronic pain or injury. Possibility of reherniation especially with smoking, obesity, diabetes, immunosuppression, and other health conditions was discussed. We will work to minimize complications.  An educational handout further explaining the pathology & treatment options was given as well. Questions were answered. The patient expresses understanding & wishes to proceed with surgery.   OR FINDINGS: Periumbilical 4 x 3 cm hernia incarcerated with omentum in the setting of diastases recti.  Type of repair: Laparoscopic underlay repair   Placement of mesh: Intraperitoneal underlay repair  Name of mesh: Bard Ventralight dual sided (polypropylene / Seprafilm)  Size of mesh: 20x15cm  Orientation: Transverse  Mesh overlap:  5-7cm   DESCRIPTION:   Informed consent was confirmed. The patient underwent general anaesthesia without difficulty. The patient was positioned appropriately. VTE prevention in place. The patient's abdomen was clipped, prepped, & draped in a sterile fashion. Surgical timeout confirmed our plan.  The patient was positioned in reverse Trendelenburg. Abdominal entry was gained using optical entry technique in the left upper abdomen. Entry was clean. I induced carbon dioxide insufflation. Camera inspection revealed no injury. Extra ports were carefully placed under direct laparoscopic visualization.   I could see adhesions on the  parietal peritoneum under the  abdominal wall.   I did laparoscopic lysis of adhesions to expose the entire anterior abdominal wall.  Omentum was incarcerated around the periumbilical incisional hernia.  Eventually reduceD that down.  I primarily used focused sharp dissection.  I do not see any other major intra-abdominal adhesions or any evidence of bowel obstruction or other intra-abdominal concerns.  I freed off the falciform ligament and central peritoneum to expose the retrorectus fascia   I made sure hemostasis was good.  I mapped out the region using a needle passer.   To ensure that I would have at least 5 cm radial coverage outside of the hernia defect, I chose a 20x15cm dual sided mesh.  I placed #1 Prolene stitches around its edge about every 5 cm = 12 total.  I rolled the mesh & placed into the peritoneal cavity through the hernia defect.  I unrolled the mesh and positioned it appropriately.  We lowered intra-abdominal pressure.  I secured the mesh to cover up the hernia defect using a laparoscopic suture passer to pass the tails of the Prolene through the abdominal wall & tagged them with clamps for good transfascial suturing.  I started out in four corners to make sure I had the mesh centered under the hernia defect appropriately, and then proceeded to work in quadrants.    We evacuated CO2 & desufflated the abdomen.  I tied the fascial stitches down. I closed the fascial defect that I placed the mesh through using #1 PDS interrupted transverse stitches primarily.  I reinsufflated the abdomen. The mesh provided at least circumferential coverage around the entire region of hernia defects.  I secured the mesh centrally with an additional trans fascial stitch in & out the mesh using #1 PDS under laparoscopic visualization.   I tacked the edges & central part of the mesh to the peritoneum/posterior rectus fascia with SecureStrap absorbable tacks.   I did reinspection. Hemostasis was good. Mesh laid  well.   Capnoperitoneum was evacuated. Ports were removed. The skin was closed with Monocryl at the port sites and Steri-Strips on the fascial stitch puncture sites.  Patient is being extubated to go to the recovery room.  I discussed operative findings, updated the patient's status, discussed probable steps to recovery, and gave postoperative recommendations to the patient's spouse.  Recommendations were made.  Questions were answered.  He expressed understanding & appreciation.  I strongly recommended the patient use her binder, ice, heat, ibuprofen, Robaxin before reaching for her narcotic prescription.  I wrote for a small volume of that since she has been on Suboxone.  Adin Hector, M.D., F.A.C.S. Gastrointestinal and Minimally Invasive Surgery [facility] [address] [place], [place] [address] [phone] Main / Paging  10/13/2019 1:24 PM

## 2019-10-13 NOTE — Anesthesia Preprocedure Evaluation (Addendum)
Anesthesia Evaluation  Patient identified by MRN, date of birth, ID band Patient awake    Reviewed: Allergy & Precautions, NPO status , Patient's Chart, lab work & pertinent test results  History of Anesthesia Complications (+) PONV and history of anesthetic complications  Airway Mallampati: II  TM Distance: >3 FB Neck ROM: Full    Dental  (+) Dental Advisory Given   Pulmonary former smoker,    Pulmonary exam normal        Cardiovascular negative cardio ROS Normal cardiovascular exam     Neuro/Psych negative neurological ROS  negative psych ROS   GI/Hepatic negative GI ROS, (+)     substance abuse (now on suboxone)  ,   Endo/Other  diabetes, Gestational  Renal/GU negative Renal ROS     Musculoskeletal negative musculoskeletal ROS (+) narcotic dependent  Abdominal   Peds  Hematology negative hematology ROS (+)   Anesthesia Other Findings Covid neg 5/17   Reproductive/Obstetrics  S/p tubal ligation                            Anesthesia Physical Anesthesia Plan  ASA: II  Anesthesia Plan: General   Post-op Pain Management:    Induction: Intravenous  PONV Risk Score and Plan: 4 or greater and Treatment may vary due to age or medical condition, Ondansetron, Dexamethasone, Midazolam and Scopolamine patch - Pre-op  Airway Management Planned: Oral ETT  Additional Equipment: None  Intra-op Plan:   Post-operative Plan: Extubation in OR  Informed Consent: I have reviewed the patients History and Physical, chart, labs and discussed the procedure including the risks, benefits and alternatives for the proposed anesthesia with the patient or authorized representative who has indicated his/her understanding and acceptance.     Dental advisory given  Plan Discussed with: CRNA and Anesthesiologist  Anesthesia Plan Comments:        Anesthesia Quick Evaluation

## 2019-10-13 NOTE — Anesthesia Postprocedure Evaluation (Signed)
Anesthesia Post Note  Patient: [name]  Procedure(s) Performed: LAPAROSCOPIC REPAIR OF INCISIONAL INCARCERATED ABDOMINAL WALL HERNIA WITH MESH, TAP BLOCK (N/A )     Patient location during evaluation: PACU Anesthesia Type: General Level of consciousness: awake and alert Pain management: pain level controlled Vital Signs Assessment: post-procedure vital signs reviewed and stable Respiratory status: spontaneous breathing, nonlabored ventilation and respiratory function stable Cardiovascular status: blood pressure returned to baseline and stable Postop Assessment: no apparent nausea or vomiting Anesthetic complications: no    Last Vitals:  Vitals:   10/13/19 1445 10/13/19 1500  BP: 111/74 103/63  Pulse: (!) 56 (!) 57  Resp: 12 12  Temp: 36.7 C   SpO2: 96% 97%    Last Pain:  Vitals:   10/13/19 1445  TempSrc:   PainSc: Asleep                 Beryle Lathe

## 2019-10-13 NOTE — H&P (Signed)
[name]   DOB: 09/20/1985   Patient Care Team: Alan Ripper, [place] as PCP - General (Physician Assistant) Michael Boston, MD as Consulting Physician (General Surgery)  `  `  Patient sent for surgical consultation at the request of [name], [place] ED  Chief Complaint: Periumbilical swelling in hernia.  `  `  The patient is a active woman. Required cholecystectomy 2016 for chronic cholecystitis with large stone. Works as a Educational psychologist with moderate lifting. His had some intermittent pains. Left upper quadrant 2018 with negative workup. Upper abdomen in 2019 with ultrasound showing questionable lipoma but no hernia. Was pregnant in 2018 with bulging. Periumbilical discomfort but no definite hernia. Had CT scan done in [place] last year when she had some bloating and abdominal pain with no hernia. She noticed sharp pain and pulling when she was lifting at work. Felt a popping sensation. Hernia suspected and confirmed with CAT scan.   Patient used to smoke but quit last month. He does note she had some drainage around her C-section incision 2 years ago but healed otherwise well. She had a tubal ligation with her pregnancy is not planning to get pregnant again. She is otherwise rather physically active. She had some gestational diabetes but no active diabetes now. Usually moves her bowels every day or so. Does occasionally get some bloating with eating which makes her periumbilical hernia pain worse. The smaller meals.   (Review of systems as stated in this history (HPI) or in the review of systems. Otherwise all other 12 point ROS are negative)  `  `  `  This patient encounter took 35 minutes today to perform the following: obtain history, perform exam, review outside records, interpret tests & imaging, counsel the patient on their diagnosis; and, document this encounter, including findings & plan in the electronic health record (EHR).  Past Surgical History [name], CMA; 08/03/2019 9:36 AM)  Breast Biopsy Right.  Cesarean Section - Multiple  Gallbladder Surgery - Laparoscopic [08/10/2014]: lap chole w/single site  Oral Surgery  Diagnostic Studies History [name], [place]; 08/03/2019 9:36 AM)  Colonoscopy >10 years ago  5-10 years ago  Mammogram >3 years ago  1-3 years ago  Pap Smear 1-5 years ago  Allergies [name], CMA; 08/03/2019 9:39 AM)  Amoxicillin *PENICILLINS*  Macrobid *URINARY ANTI-INFECTIVES*  Bactrim *ANTI-INFECTIVE AGENTS - MISC.*  Allergies Reconciled  Medication History [name], CMA; 08/03/2019 9:38 AM)  Ibuprofen (800MG  Tablet, Oral) Active. (as needed)  Suboxone (8-2MG  Film, Sublingual) Active.  Chantix Starting Month Pak (0.5 MG X 11 &1 MG X 42 Tablet, Oral) Active.  Medications Reconciled  Pregnancy / Birth History [name], [place]; 08/03/2019 9:36 AM)  Age at menarche 89 years, 12 years.  Gravida 4  1  Irregular periods  Maternal age 36-25  Para 2  1  Other Problems [name], [place]; 08/03/2019 9:36 AM)  Cholelithiasis  Umbilical Hernia Repair  Review of Systems [name] CMA; 08/03/2019 9:36 AM)  General Present- Fatigue. Not Present- Appetite Loss, Chills, Fever, Night Sweats, Weight Gain and Weight Loss.  Skin Present- Dryness. Not Present- Change in Wart/Mole, Hives, Jaundice, New Lesions, Non-Healing Wounds, Rash and Ulcer.  HEENT Present- Seasonal Allergies. Not Present- Earache, Hearing Loss, Hoarseness, Nose Bleed, Oral Ulcers, Ringing in the Ears, Sinus Pain, Sore Throat, Visual Disturbances, Wears glasses/contact lenses and Yellow Eyes.  Respiratory Not Present- Bloody sputum, Chronic Cough, Difficulty Breathing, Snoring and Wheezing.  Breast Not Present- Breast Mass, Breast Pain, Nipple  Discharge and Skin Changes.  Cardiovascular Present- Palpitations. Not Present- Chest Pain, Difficulty Breathing Lying Down, Leg Cramps, Rapid Heart Rate, Shortness of Breath and  Swelling of Extremities.  Gastrointestinal Present- Abdominal Pain, Bloating, Change in Bowel Habits, Constipation, Excessive gas and Gets full quickly at meals. Not Present- Bloody Stool, Chronic diarrhea, Difficulty Swallowing, Hemorrhoids, Indigestion, Nausea, Rectal Pain and Vomiting.  Female Genitourinary Not Present- Frequency, Nocturia, Painful Urination, Pelvic Pain and Urgency.  Musculoskeletal Present- Back Pain. Not Present- Joint Pain, Joint Stiffness, Muscle Pain, Muscle Weakness and Swelling of Extremities.  Neurological Not Present- Decreased Memory, Fainting, Headaches, Numbness, Seizures, Tingling, Tremor, Trouble walking and Weakness.  Psychiatric Present- Anxiety. Not Present- Bipolar, Change in Sleep Pattern, Depression, Fearful and Frequent crying.  Endocrine Present- Hair Changes. Not Present- Cold Intolerance, Excessive Hunger, Heat Intolerance, Hot flashes and New Diabetes.  Hematology Not Present- Blood Thinners, Easy Bruising, Excessive bleeding, Gland problems, HIV and Persistent Infections.   Vitals Santiago Glad CMA; 08/03/2019 9:37 AM)  08/03/2019 9:37 AM  Weight: 128.4 lb Height: 60 in  Body Surface Area: 1.55 m Body Mass Index: 25.08 kg/m  Temp.: 98.6 F Pulse: 80 (Regular)  BP: 106/76 (Sitting, Left Arm, Standard)   BP 108/61   Pulse (!) 57   Temp 98.7 F (37.1 C) (Oral)   Resp 16   Ht 5' (1.524 m)   Wt 59.2 kg   LMP 09/28/2019   SpO2 100%   BMI 25.51 kg/m    Physical Exam [name] MD; 08/03/2019 10:15 AM)  General  Mental Status - Alert.  General Appearance - Not in acute distress, Not Sickly.  Orientation - Oriented X3.  Hydration - Well hydrated.  Voice - Normal.  Integumentary  Global Assessment  Upon inspection and palpation of skin surfaces of the - Axillae: non-tender, no inflammation or ulceration, no drainage. and Distribution of scalp and body hair is normal.  General Characteristics  Temperature - normal warmth is noted.    Head and Neck  Head - normocephalic, atraumatic with no lesions or palpable masses.  Face  Global Assessment - atraumatic, no absence of expression.  Neck  Global Assessment - no abnormal movements, no bruit auscultated on the right, no bruit auscultated on the left, no decreased range of motion, non-tender.  Trachea - midline.  Thyroid  Gland Characteristics - non-tender.  Eye  Eyeball - Left - Extraocular movements intact, No Nystagmus - Left.  Eyeball - Right - Extraocular movements intact, No Nystagmus - Right.  Cornea - Left - No Hazy - Left.  Cornea - Right - No Hazy - Right.  Sclera/Conjunctiva - Left - No scleral icterus, No Discharge - Left.  Sclera/Conjunctiva - Right - No scleral icterus, No Discharge - Right.  Pupil - Left - Direct reaction to light normal.  Pupil - Right - Direct reaction to light normal.  ENMT  Ears  Pinna - Left - no drainage observed, no generalized tenderness observed. Pinna - Right - no drainage observed, no generalized tenderness observed.  Nose and Sinuses  External Inspection of the Nose - no destructive lesion observed. Inspection of the nares - Left - quiet respiration. Inspection of the nares - Right - quiet respiration.  Mouth and Throat  Lips - Upper Lip - no fissures observed, no pallor noted. Lower Lip - no fissures observed, no pallor noted. Nasopharynx - no discharge present. Oral Cavity/Oropharynx - Tongue - no dryness observed. Oral Mucosa - no cyanosis observed. Hypopharynx - no evidence of  airway distress observed.  Chest and Lung Exam  Inspection  Movements - Normal and Symmetrical. Accessory muscles - No use of accessory muscles in breathing.  Palpation  Palpation of the chest reveals - Non-tender.  Auscultation  Breath sounds - Normal and Clear.  Cardiovascular  Auscultation  Rhythm - Regular. Murmurs & Other Heart Sounds - Auscultation of the heart reveals - No Murmurs and No Systolic Clicks.  Abdomen  Inspection   Inspection of the abdomen reveals - No Visible peristalsis and No Abnormal pulsations. Umbilicus - No Bleeding, No Urine drainage.  Palpation/Percussion  Palpation and Percussion of the abdomen reveal - Soft, Non Tender, No Rebound tenderness, No Rigidity (guarding) and No Cutaneous hyperesthesia.  Note: Abdomen soft. Mild diastases recti. 3 x 2 cm periumbilical mass consistent with incarcerated hernia. Mildly sensitive. No evidence of any ischemia or strangulation or cellulitis. Not severely distended.  Female Genitourinary  Sexual Maturity  Tanner 5 - Adult hair pattern.  Note: Pfannenstiel incision well healed. No pain in still or inguinal hernias. No inguinal lymphadenopathy. No vaginal bleeding nor discharge  Peripheral Vascular  Upper Extremity  Inspection - Left - No Cyanotic nailbeds - Left, Not Ischemic. Inspection - Right - No Cyanotic nailbeds - Right, Not Ischemic.  Neurologic  Neurologic evaluation reveals - normal attention span and ability to concentrate, able to name objects and repeat phrases. Appropriate fund of knowledge , normal sensation and normal coordination.  Mental Status  Affect - not angry, not paranoid.  Cranial Nerves - Normal Bilaterally.  Gait - Normal.  Neuropsychiatric  Mental status exam performed with findings of - able to articulate well with normal speech/language, rate, volume and coherence, thought content normal with ability to perform basic computations and apply abstract reasoning and no evidence of hallucinations, delusions, obsessions or homicidal/suicidal ideation.  Musculoskeletal  Global Assessment  Spine, Ribs and Pelvis - no instability, subluxation or laxity. Right Upper Extremity - no instability, subluxation or laxity.  Lymphatic  Head & Neck  General Head & Neck Lymphatics: Bilateral - Description - No Localized lymphadenopathy.  Axillary  General Axillary Region: Bilateral - Description - No Localized lymphadenopathy.  Femoral &  Inguinal  Generalized Femoral & Inguinal Lymphatics: Left - Description - No Localized lymphadenopathy. Right - Description - No Localized lymphadenopathy.  Assessment & Plan [name] MD; 08/03/2019 10:14 AM)  Louann Liv HERNIA (K43.0)  Impression: Small but obvious periumbilical incarcerated incisional hernia containing fat by CAT scan. No evidence year ago.  I think she would benefit from surgical repair. Laparoscopic approach. I think she just has diastases supraumbilically but that would allow to make sure she does not have a suprapubic umbilical/epigastric hernia as well. I would do mesh repair since she failed with adequate suturing.  The fact that she is quit smoking, we'll no longer get pregnant, and is trying to transition away from heavy lifting or hopeful signs that we can minimize recurrence.   PREOP - [facility] - ENCOUNTER FOR PREOPERATIVE EXAMINATION FOR GENERAL SURGICAL PROCEDURE (Z01.818)  Current Plans  You are being scheduled for surgery - Our schedulers will call you.  You should hear from our office's scheduling department within 5 working days about the location, date, and time of surgery. We try to make accommodations for patient's preferences in scheduling surgery, but sometimes the OR schedule or the surgeon's schedule prevents [place] from making those accommodations.  If you have not heard from our office [phone]) in 5 working days, call the office and ask  for your surgeon's nurse.  If you have other questions about your diagnosis, plan, or surgery, call the office and ask for your surgeon's nurse.  Written instructions provided  CCS Consent - Hernia Repair - Ventral/Incisional/Umbilical (Ariauna Farabee): discussed with patient and provided information.  Pt Education - CCS Hernia Post-Op HCI (Ryaan Vanwagoner): discussed with patient and provided information.  Pt Education - CCS Pain Control (Dymphna Wadley)  Pt Education - Pamphlet Given - Laparoscopic Hernia Repair: discussed with  patient and provided information.  Pt Education - CCS Mesh education: discussed with patient and provided information.  DIASTASIS RECTI (M62.08)  Current Plans  Pt Education - CCS Diastasis Recti: discussed with patient and provided information.    [name], MD, FACS, MASCRS  Gastrointestinal and Minimally Invasive Surgery  [facility]  [address]  [place], [place] [address]  [phone] Main / Paging  [phone] Fax

## 2019-10-14 ENCOUNTER — Encounter: Payer: Self-pay | Admitting: *Deleted

## 2020-06-07 ENCOUNTER — Ambulatory Visit: Payer: Commercial Managed Care - PPO | Attending: Physician Assistant | Admitting: Physical Therapy

## 2020-06-07 ENCOUNTER — Other Ambulatory Visit: Payer: Self-pay

## 2020-06-07 DIAGNOSIS — M62830 Muscle spasm of back: Secondary | ICD-10-CM | POA: Insufficient documentation

## 2020-06-07 DIAGNOSIS — M6281 Muscle weakness (generalized): Secondary | ICD-10-CM | POA: Diagnosis present

## 2020-06-07 DIAGNOSIS — R293 Abnormal posture: Secondary | ICD-10-CM | POA: Insufficient documentation

## 2020-06-07 NOTE — Therapy (Addendum)
[facility] [address] [place], [place], [address] Phone: [phone]   Fax:  [phone]  Physical Therapy Evaluation  Patient Details  Name: [name] MRN: [id number] Date of Birth: 1985/07/09 Referring Provider (PT): Durward Fortes, [place]   Encounter Date: 06/07/2020   PT End of Session - 06/08/20 1140    Visit Number 1    Date for PT Re-Evaluation 08/30/20    PT Start Time 1615    PT Stop Time 1703    PT Time Calculation (min) 48 min    Activity Tolerance Patient tolerated treatment well    Behavior During Therapy [facility] for tasks assessed/performed           Past Medical History:  Diagnosis Date  . Dysrhythmia 2020   Flutter in chest  . Family history of adverse reaction to anesthesia   . Gestational diabetes   . History of cesarean section, low transverse 10/07/2016  . Opioid abuse (HCC)    on Suboxone  . PONV (postoperative nausea and vomiting)   . S/P cesarean section 10/08/2016  . Status post tubal ligation at time of delivery, current hospitalization 10/08/2016  . Vaginal Pap smear, abnormal     Past Surgical History:  Procedure Laterality Date  . CESAREAN SECTION    . CESAREAN SECTION WITH BILATERAL TUBAL LIGATION Bilateral 10/08/2016   Procedure: REPEAT CESAREAN SECTION WITH BILATERAL TUBAL LIGATION;  Surgeon: Sherian Rein, MD;  Location: [facility];  Service: Obstetrics;  Laterality: Bilateral;  Heather K to RNFA  . CHOLECYSTECTOMY    . LEEP    . LEEP    . LEEP    . VENTRAL HERNIA REPAIR N/A 10/13/2019   Procedure: LAPAROSCOPIC REPAIR OF INCISIONAL INCARCERATED ABDOMINAL WALL HERNIA WITH MESH, TAP BLOCK;  Surgeon: Karie Soda, MD;  Location: [facility];  Service: General;  Laterality: N/A;    There were no vitals filed for this visit.    Subjective Assessment - 06/07/20 1622    Subjective Pt had hernia surgery and c-section.  Pt feels back pain that has worsened.  Straining  for BM 80% of the time since the abdominal surgery.  Pt states she is unable to stop gas recently such as when walking.    Pertinent History 2 c-sections; umbilical hernia repair    Limitations House hold activities    Patient Stated Goals work without pain, be able to exercise and feel core is stronger    Currently in Pain? Yes    Pain Score 2    7/10 if lifting the wrong way; now it is sore and achy   Pain Location Abdomen    Pain Orientation Lower    Pain Descriptors / Indicators Sore    Pain Type Acute pain    Pain Onset More than a month ago    Pain Frequency Intermittent    Aggravating Factors  working and lifting, overdo it    Pain Relieving Factors compression shirt    Effect of Pain on Daily Activities unable to do normal things without pain    Multiple Pain Sites No              [facility] PT Assessment - 06/11/20 0001      Assessment   Medical Diagnosis G28.4 abdominal wall weak    Referring Provider (PT) Durward Fortes, PA      Precautions   Precautions None      Balance Screen   Has the patient fallen in the  past 6 months No      Home Nurse, mental health Private residence    Living Arrangements Spouse/significant other;Children   4 children     Prior Function   Level of Independence Independent    Vocation Full time employment    Vocation Requirements server/bar tender      Cognition   Overall Cognitive Status Within Functional Limits for tasks assessed      Posture/Postural Control   Posture/Postural Control Postural limitations    Postural Limitations Anterior pelvic tilt;Increased lumbar lordosis;Rounded Shoulders      ROM / Strength   AROM / PROM / Strength AROM;PROM;Strength      AROM   Overall AROM Comments lumbar flexion 50%      PROM   Overall PROM Comments hip rotation 50%      Strength   Overall Strength Comments Lt hip 4/5; abdominal wall weak and unalbe to coordinate with breathing      Flexibility   Soft Tissue Assessment  /Muscle Length yes    Hamstrings 50%    Piriformis 50%      Palpation   Palpation comment lumbar, glutes, hamstrings tight      Ambulation/Gait   Gait Pattern Decreased stride length                      Objective measurements completed on examination: See above findings.     Pelvic Floor Special Questions - 06/11/20 0001    Prior Pelvic/Prostate Exam Yes    Are you Pregnant or attempting pregnancy? Yes    Number of Pregnancies 2    Number of C-Sections 2    Currently Sexually Active Yes    Is this Painful No    Urinary Leakage No    Urinary urgency Yes    Fecal incontinence --   gas leakage that is worsening   Pelvic Floor Internal Exam pt identity confirmed and internal assessed with informed consent    Exam Type Vaginal    Strength fair squeeze, definite lift    Strength # of seconds 3    Tone high                      PT Short Term Goals - 06/11/20 1513      PT SHORT TERM GOAL #1   Title pt will report 25% less pain    Time 4    Period Weeks    Status New      PT SHORT TERM GOAL #2   Title pt will be ind with initial HEP    Time 4    Period Weeks    Status New             PT Long Term Goals - 06/07/20 1711      PT LONG TERM GOAL #1   Title Pt will report 75% less pain during long shift at work    Baseline up to 7/10    Time 12    Period Weeks    Status New    Target Date 08/30/20      PT LONG TERM GOAL #2   Title Pt will be able to correctly engage the abdominal wall for ability to correctly squat for functional job related duties without pain    Time 12    Period Weeks    Status New    Target Date 08/30/20      PT LONG TERM GOAL #3  Title Pt will be able to perform hip hinge correctly for functional lifting technique without pain during job related duties    Time 12    Period Weeks    Status New    Target Date 08/30/20      PT LONG TERM GOAL #4   Title Pt wll be ind with advanced HEP    Time 12    Period  Weeks    Status New    Target Date 08/30/20      PT LONG TERM GOAL #5   Title Pt will report 50% less gas leakage due to improved abdominal pressure management    Time 12    Period Weeks    Status New    Target Date 08/30/20                  Plan - 06/11/20 1514    Clinical Impression Statement Pt presents to skilled PT due to pain and weakness she has experienced since hernia repair surgery.  Pt demonstrates Lt hip weakness, core weakness.  Pt has posture abnormalities as mentioned above with significant tightness throughout lumbar, glutes and hamstrings and significant abdominal weakness. Pt has fascial restrictions along linea alba.  Pt will benefit from skilled PT to address impairments for improved functional movement patterns and strength so she can pariticipate fully in her daily activities and job.    Personal Factors and Comorbidities Comorbidity 3+    Comorbidities hx of hernia, mesh    Examination-Activity Limitations Lift;Carry    Examination-Participation Restrictions Cleaning;Community Activity;Occupation    Stability/Clinical Decision Making Unstable/Unpredictable    Clinical Decision Making High    Rehab Potential Good    PT Frequency 1x / week    PT Duration 12 weeks    PT Treatment/Interventions ADLs/Self Care Home Management;Biofeedback;Cryotherapy;Electrical Stimulation;Moist Heat;Therapeutic activities;Therapeutic exercise;Neuromuscular re-education;Patient/family education;Manual techniques;Passive range of motion;Dry needling;Taping    PT Next Visit Plan abdominal release, h/s and lumbar stretches, TrA activation    Consulted and Agree with Plan of Care Patient           Patient will benefit from skilled therapeutic intervention in order to improve the following deficits and impairments:  Abnormal gait,Decreased activity tolerance,Decreased coordination,Impaired flexibility,Postural dysfunction,Decreased range of motion,Increased muscle  spasms,Pain,Increased fascial restricitons,Decreased strength  Visit Diagnosis: Muscle weakness (generalized)  Muscle spasm of back  Abnormal posture     Problem List Patient Active Problem List   Diagnosis Date Noted  . Abnormal cervical Papanicolaou smear 10/13/2019  . Pain in pelvis 10/13/2019  . Left sided abdominal pain 10/13/2019  . 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 Incarcerated incisional hernia 08/03/2019  . Diastasis recti 08/03/2019  . S/P cesarean section 10/08/2016  . Status post tubal ligation at time of delivery, current hospitalization 10/08/2016  . History of cesarean section, low transverse 10/07/2016  . GERD 03/07/2008  . ABDOMINAL PAIN 03/02/2008  . ABDOMINAL XRAY, ABNORMAL 03/02/2008    Junious Silk, PT 06/12/2020, 9:42 AM  [facility] [address] [place], [place], [address] Phone: [phone]   Fax:  [phone]  Name: [name] MRN: [id number] Date of Birth: 27-Dec-1985

## 2020-06-11 ENCOUNTER — Encounter: Payer: Self-pay | Admitting: Physical Therapy

## 2020-06-11 NOTE — Addendum Note (Signed)
Addended by: Beatris Si on: 06/11/2020 03:41 PM   Modules accepted: Orders

## 2020-06-13 NOTE — Addendum Note (Signed)
Addended by: Beatris Si on: 06/13/2020 10:59 AM   Modules accepted: Orders

## 2020-07-16 ENCOUNTER — Ambulatory Visit: Payer: Medicaid Other | Admitting: Physical Therapy

## 2020-07-24 ENCOUNTER — Other Ambulatory Visit: Payer: Self-pay

## 2020-07-24 ENCOUNTER — Encounter: Payer: Self-pay | Admitting: Physical Therapy

## 2020-07-24 ENCOUNTER — Ambulatory Visit: Payer: Medicaid Other | Attending: Physician Assistant | Admitting: Physical Therapy

## 2020-07-24 DIAGNOSIS — R293 Abnormal posture: Secondary | ICD-10-CM | POA: Diagnosis present

## 2020-07-24 DIAGNOSIS — M6281 Muscle weakness (generalized): Secondary | ICD-10-CM | POA: Diagnosis present

## 2020-07-24 DIAGNOSIS — M62830 Muscle spasm of back: Secondary | ICD-10-CM | POA: Insufficient documentation

## 2020-07-24 NOTE — Therapy (Signed)
[facility] [address], [place], [place], [address] Phone: [phone]   Fax:  [phone]  Physical Therapy Treatment  Patient Details  Name: [name] MRN: [id number] Date of Birth: [birth year] Referring Provider (PT): Milas Gain, [place]   Encounter Date: 07/24/2020   PT End of Session - 07/24/20 0940    Visit Number 2    Date for PT Re-Evaluation 08/30/20    Authorization Type [employer] secondary    PT Start Time 0937    PT Stop Time 1015    PT Time Calculation (min) 38 min    Activity Tolerance Patient tolerated treatment well    Behavior During Therapy [facility] for tasks assessed/performed           Past Medical History:  Diagnosis Date  . Dysrhythmia 2020   Flutter in chest  . Family history of adverse reaction to anesthesia   . Gestational diabetes   . History of cesarean section, low transverse 10/07/2016  . Opioid abuse ([place])    on Suboxone  . PONV (postoperative nausea and vomiting)   . S/P cesarean section 10/08/2016  . Status post tubal ligation at time of delivery, current hospitalization 10/08/2016  . Vaginal Pap smear, abnormal     Past Surgical History:  Procedure Laterality Date  . CESAREAN SECTION    . CESAREAN SECTION WITH BILATERAL TUBAL LIGATION Bilateral 10/08/2016   Procedure: REPEAT CESAREAN SECTION WITH BILATERAL TUBAL LIGATION;  Surgeon: Janyth Contes, MD;  Location: [place];  Service: Obstetrics;  Laterality: Bilateral;  Heather K to RNFA  . CHOLECYSTECTOMY    . LEEP    . LEEP    . LEEP    . VENTRAL HERNIA REPAIR N/A 10/13/2019   Procedure: LAPAROSCOPIC REPAIR OF INCISIONAL INCARCERATED ABDOMINAL WALL HERNIA WITH MESH, TAP BLOCK;  Surgeon: Michael Boston, MD;  Location: [facility];  Service: General;  Laterality: N/A;    There were no vitals filed for this visit.   Subjective Assessment - 07/24/20 1146    Subjective I am not sure I am doing the breathing  right.  Pt states not as much swelling as it was.    Patient Stated Goals work without pain, be able to exercise and feel core is stronger    Currently in Pain? No/denies                             [facility] Adult PT Treatment/Exercise - 07/24/20 0001      Neuro Re-ed    Neuro Re-ed Details  engaging transversus abdominals with breathing - exhale with contraction      Exercises   Exercises Lumbar      Lumbar Exercises: Stretches   Other Lumbar Stretch Exercise thoracic ext UE overhead; trunk rotation in sidelying      Lumbar Exercises: Quadruped   Madcat/Old Horse 10 reps    Other Quadruped Lumbar Exercises transversus abs with exhale      Manual Therapy   Manual Therapy Myofascial release    Myofascial Release abdominal fascial release Rt>Lt                  PT Education - 07/24/20 1143    Education Details Access Code: [email or web address]    Person(s) Educated Patient    Methods Explanation;Demonstration;Tactile cues;Verbal cues;Handout    Comprehension Verbalized understanding;Returned demonstration            PT Short Term Goals -  06/11/20 1513      PT SHORT TERM GOAL #1   Title pt will report 25% less pain    Time 4    Period Weeks    Status New      PT SHORT TERM GOAL #2   Title pt will be ind with initial HEP    Time 4    Period Weeks    Status New             PT Long Term Goals - 06/07/20 1711      PT LONG TERM GOAL #1   Title Pt will report 75% less pain during long shift at work    Baseline up to 7/10    Time 12    Period Weeks    Status New    Target Date 08/30/20      PT LONG TERM GOAL #2   Title Pt will be able to correctly engage the abdominal wall for ability to correctly squat for functional job related duties without pain    Time 12    Period Weeks    Status New    Target Date 08/30/20      PT LONG TERM GOAL #3   Title Pt will be able to perform hip hinge correctly for functional lifting technique without pain  during job related duties    Time 12    Period Weeks    Status New    Target Date 08/30/20      PT LONG TERM GOAL #4   Title Pt wll be ind with advanced HEP    Time 12    Period Weeks    Status New    Target Date 08/30/20      PT LONG TERM GOAL #5   Title Pt will report 50% less gas leakage due to improved abdominal pressure management    Time 12    Period Weeks    Status New    Target Date 08/30/20                 Plan - 07/24/20 1021    Clinical Impression Statement Pt was feeling a little better and less swelling.  Pt needed to review breathing and engaging the core.  She did wll with qped ex's.  Pt was stiff throughout thoracic with extensions.  pt had good fascial release in abodmen especially on the Rt side.  Continue skilled PT per POC, no goals met today at initial f/u visit.    PT Treatment/Interventions ADLs/Self Care Home Management;Biofeedback;Cryotherapy;Electrical Stimulation;Moist Heat;Therapeutic activities;Therapeutic exercise;Neuromuscular re-education;Patient/family education;Manual techniques;Passive range of motion;Dry needling;Taping    PT Next Visit Plan h/s stretch and progress core strength, abdominal fascial release f/u    PT Home Exercise Plan Access Code: [email or web address]    Consulted and Agree with Plan of Care Patient           Patient will benefit from skilled therapeutic intervention in order to improve the following deficits and impairments:  Abnormal gait,Decreased activity tolerance,Decreased coordination,Impaired flexibility,Postural dysfunction,Decreased range of motion,Increased muscle spasms,Pain,Increased fascial restricitons,Decreased strength  Visit Diagnosis: No diagnosis found.     Problem List Patient Active Problem List   Diagnosis Date Noted  . Abnormal cervical Papanicolaou smear 10/13/2019  . Pain in pelvis 10/13/2019  . Left sided abdominal pain 10/13/2019  . 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  Incarcerated incisional hernia 08/03/2019  . Diastasis recti 08/03/2019  . S/P cesarean section 10/08/2016  . Status post tubal  ligation at time of delivery, current hospitalization 10/08/2016  . History of cesarean section, low transverse 10/07/2016  . GERD 03/07/2008  . ABDOMINAL PAIN 03/02/2008  . ABDOMINAL XRAY, ABNORMAL 03/02/2008    Jule Ser, PT 07/24/2020, 4:00 PM  [facility] [address] [place], [place], [address] Phone: [phone]   Fax:  [phone]  Name: [name] MRN: [id number] Date of Birth: 20-Jan-1986

## 2020-07-24 NOTE — Patient Instructions (Signed)
Access Code: [email or web address] URL: [email or web address] Date: 07/24/2020 Prepared by: Dwana Curd  Exercises Quadruped Cat Camel - 1 x daily - 7 x weekly - 1 sets - 10 reps Static Prone on Elbows - 1 x daily - 7 x weekly - 1 sets - 3 reps - 60 hold Quadruped Transversus Abdominis Bracing - 1 x daily - 7 x weekly - 2 sets - 10 reps - 5 sec hold

## 2020-07-31 ENCOUNTER — Encounter: Payer: Self-pay | Admitting: Physical Therapy

## 2020-07-31 ENCOUNTER — Other Ambulatory Visit: Payer: Self-pay

## 2020-07-31 ENCOUNTER — Ambulatory Visit: Payer: Medicaid Other | Admitting: Physical Therapy

## 2020-07-31 DIAGNOSIS — R293 Abnormal posture: Secondary | ICD-10-CM

## 2020-07-31 DIAGNOSIS — M6281 Muscle weakness (generalized): Secondary | ICD-10-CM

## 2020-07-31 DIAGNOSIS — M62830 Muscle spasm of back: Secondary | ICD-10-CM

## 2020-07-31 NOTE — Therapy (Signed)
[facility] [address], [address] [place], [place], [address] Phone: [phone]   Fax:  [phone]  Physical Therapy Treatment  Patient Details  Name: [name] MRN: [id number] Date of Birth: 05-Nov-1985 Referring Provider (PT): Durward Fortes, [place]   Encounter Date: 07/31/2020   PT End of Session - 07/31/20 0956    Visit Number 3    Date for PT Re-Evaluation 08/30/20    Authorization Type [employer] secondary    PT Start Time 0934    PT Stop Time 1012    PT Time Calculation (min) 38 min    Activity Tolerance Patient tolerated treatment well    Behavior During Therapy [facility] for tasks assessed/performed           Past Medical History:  Diagnosis Date  . Dysrhythmia 2020   Flutter in chest  . Family history of adverse reaction to anesthesia   . Gestational diabetes   . History of cesarean section, low transverse 10/07/2016  . Opioid abuse ([facility])    on Suboxone  . PONV (postoperative nausea and vomiting)   . S/P cesarean section 10/08/2016  . Status post tubal ligation at time of delivery, current hospitalization 10/08/2016  . Vaginal Pap smear, abnormal     Past Surgical History:  Procedure Laterality Date  . CESAREAN SECTION    . CESAREAN SECTION WITH BILATERAL TUBAL LIGATION Bilateral 10/08/2016   Procedure: REPEAT CESAREAN SECTION WITH BILATERAL TUBAL LIGATION;  Surgeon: Sherian Rein, MD;  [facility];  Service: Obstetrics;  Laterality: Bilateral;  Heather K to RNFA  . CHOLECYSTECTOMY    . LEEP    . LEEP    . LEEP    . VENTRAL HERNIA REPAIR N/A 10/13/2019   Procedure: LAPAROSCOPIC REPAIR OF INCISIONAL INCARCERATED ABDOMINAL WALL HERNIA WITH MESH, TAP BLOCK;  Surgeon: Karie Soda, MD;  Location[facility];  Service: General;  Laterality: N/A;    There were no vitals filed for this visit.   Subjective Assessment - 07/31/20 0946    Subjective I feel less discomfort 50% less frequently,  I think not wearing the tight pants has helped. I have been using the bathroom better.    Pertinent History 2 c-sections; umbilical hernia repair    Limitations House hold activities    Patient Stated Goals work without pain, be able to exercise and feel core is stronger    Currently in Pain? No/denies                             [facility] Adult PT Treatment/Exercise - 07/31/20 0001      Lumbar Exercises: Stretches   Active Hamstring Stretch Right;Left;60 seconds    Quad Stretch Right;Left;60 seconds      Lumbar Exercises: Seated   Long Arc Quad on Chair Strengthening;Right;Left    LAQ on Chair Limitations core set and posture corrected    Hip Flexion on Ball Strengthening;Both;20 reps    Hip Flexion on Ball Limitations core set sitting    Other Seated Lumbar Exercises activate core in sitting      Lumbar Exercises: Supine   Other Supine Lumbar Exercises core with heel slide and marching - 20x each      Manual Therapy   Myofascial Release abdominal fascial release bilat                    PT Short Term Goals - 07/31/20 9518  PT SHORT TERM GOAL #1   Title pt will report 25% less pain    Baseline 50% less    Status Achieved      PT SHORT TERM GOAL #2   Title pt will be ind with initial HEP    Status Achieved             PT Long Term Goals - 07/31/20 0952      PT LONG TERM GOAL #1   Title Pt will report 75% less pain during long shift at work    Baseline up to 5/10    Status On-going      PT LONG TERM GOAL #2   Title Pt will be able to correctly engage the abdominal wall for ability to correctly squat for functional job related duties without pain      PT LONG TERM GOAL #3   Title Pt will be able to perform hip hinge correctly for functional lifting technique without pain during job related duties      PT LONG TERM GOAL #4   Title Pt wll be ind with advanced HEP    Status On-going                 Plan - 07/31/20 1231     Clinical Impression Statement Pt is feeling much better with meeting short term goal for pain.  She got better muscle activitiy in transverse abs and was able to progress the exercises.  Pt also had more fascial release in the abdomen.  Pt will benefit from skilled PT to continue to progress strenth and core.    PT Treatment/Interventions ADLs/Self Care Home Management;Biofeedback;Cryotherapy;Electrical Stimulation;Moist Heat;Therapeutic activities;Therapeutic exercise;Neuromuscular re-education;Patient/family education;Manual techniques;Passive range of motion;Dry needling;Taping    PT Next Visit Plan h/s stretch and progress core strength, abdominal fascial release f/u    PT Home Exercise Plan Access Code: [email or web address]    Consulted and Agree with Plan of Care Patient           Patient will benefit from skilled therapeutic intervention in order to improve the following deficits and impairments:  Abnormal gait,Decreased activity tolerance,Decreased coordination,Impaired flexibility,Postural dysfunction,Decreased range of motion,Increased muscle spasms,Pain,Increased fascial restricitons,Decreased strength  Visit Diagnosis: Muscle weakness (generalized)  Muscle spasm of back  Abnormal posture     Problem List Patient Active Problem List   Diagnosis Date Noted  . Abnormal cervical Papanicolaou smear 10/13/2019  . Pain in pelvis 10/13/2019  . Left sided abdominal pain 10/13/2019  . 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 Incarcerated incisional hernia 08/03/2019  . Diastasis recti 08/03/2019  . S/P cesarean section 10/08/2016  . Status post tubal ligation at time of delivery, current hospitalization 10/08/2016  . History of cesarean section, low transverse 10/07/2016  . GERD 03/07/2008  . ABDOMINAL PAIN 03/02/2008  . ABDOMINAL XRAY, ABNORMAL 03/02/2008    Junious Silk, PT 07/31/2020, 12:34 PM  [facility] [address] [place], [place], [address] Phone: [phone]   Fax:  [phone]  Name: Onica Davidovich MRN: [id number] Date of Birth: [birth year]

## 2020-08-07 ENCOUNTER — Other Ambulatory Visit: Payer: Self-pay

## 2020-08-07 ENCOUNTER — Ambulatory Visit: Payer: Medicaid Other | Admitting: Physical Therapy

## 2020-08-07 ENCOUNTER — Encounter: Payer: Self-pay | Admitting: Physical Therapy

## 2020-08-07 DIAGNOSIS — R293 Abnormal posture: Secondary | ICD-10-CM

## 2020-08-07 DIAGNOSIS — M6281 Muscle weakness (generalized): Secondary | ICD-10-CM

## 2020-08-07 DIAGNOSIS — M62830 Muscle spasm of back: Secondary | ICD-10-CM

## 2020-08-07 NOTE — Therapy (Signed)
[facility] [address] [place], [place], [address] Phone: [phone]   Fax:  [phone]  Physical Therapy Treatment  Patient Details  Name: [name] MRN: [id number] Date of Birth: 02-17-1986 Referring Provider (PT): Durward Fortes, [place]   Encounter Date: 08/07/2020   PT End of Session - 08/07/20 0958    Visit Number 4    Date for PT Re-Evaluation 08/30/20    Authorization Type [employer] blue secondary    PT Start Time 0933    PT Stop Time 1013    PT Time Calculation (min) 40 min    Activity Tolerance Patient tolerated treatment well    Behavior During Therapy [facility] for tasks assessed/performed           Past Medical History:  Diagnosis Date  . Dysrhythmia 2020   Flutter in chest  . Family history of adverse reaction to anesthesia   . Gestational diabetes   . History of cesarean section, low transverse 10/07/2016  . Opioid abuse ([facility])    on Suboxone  . PONV (postoperative nausea and vomiting)   . S/P cesarean section 10/08/2016  . Status post tubal ligation at time of delivery, current hospitalization 10/08/2016  . Vaginal Pap smear, abnormal     Past Surgical History:  Procedure Laterality Date  . CESAREAN SECTION    . CESAREAN SECTION WITH BILATERAL TUBAL LIGATION Bilateral 10/08/2016   Procedure: REPEAT CESAREAN SECTION WITH BILATERAL TUBAL LIGATION;  Surgeon: Sherian Rein, MD;  [facility];  Service: Obstetrics;  Laterality: Bilateral;  Heather K to RNFA  . CHOLECYSTECTOMY    . LEEP    . LEEP    . LEEP    . VENTRAL HERNIA REPAIR N/A 10/13/2019   Procedure: LAPAROSCOPIC REPAIR OF INCISIONAL INCARCERATED ABDOMINAL WALL HERNIA WITH MESH, TAP BLOCK;  Surgeon: Karie Soda, MD;  Location[facility];  Service: General;  Laterality: N/A;    There were no vitals filed for this visit.   Subjective Assessment - 08/07/20 0937    Subjective I was sore especially after doing the  exercises several days and had sex Saturday and then Sunday was really sore sitting at church, maybe from wearing jeans.    Pertinent History 2 c-sections; umbilical hernia repair    Patient Stated Goals work without pain, be able to exercise and feel core is stronger    Currently in Pain? No/denies                             [facility] Adult PT Treatment/Exercise - 08/07/20 0001      Neuro Re-ed    Neuro Re-ed Details  pelvic tilts without breath holding      Lumbar Exercises: Standing   Other Standing Lumbar Exercises hip hinge with flat back normal support, staggared stance, forward T    Other Standing Lumbar Exercises pelvic tilt at the wall      Lumbar Exercises: Supine   Pelvic Tilt 10 reps    Pelvic Tilt Limitations VC, TC      Manual Therapy   Manual Therapy Internal Pelvic Floor    Manual therapy comments pt identiy confimred and internal soft tissue performed    Internal Pelvic Floor Rt pubocyccygeus and levators                  PT Education - 08/07/20 1228    Education Details Access Code: [email or web address]    Person(s)  Educated Patient    Methods Explanation;Demonstration;Verbal cues;Tactile cues;Handout    Comprehension Verbalized understanding;Returned demonstration            PT Short Term Goals - 07/31/20 0946      PT SHORT TERM GOAL #1   Title pt will report 25% less pain    Baseline 50% less    Status Achieved      PT SHORT TERM GOAL #2   Title pt will be ind with initial HEP    Status Achieved             PT Long Term Goals - 08/07/20 1107      PT LONG TERM GOAL #1   Title Pt will report 75% less pain during long shift at work    Status On-going      PT LONG TERM GOAL #2   Title Pt will be able to correctly engage the abdominal wall for ability to correctly squat for functional job related duties without pain    Status On-going      PT LONG TERM GOAL #3   Title Pt will be able to perform hip hinge correctly for  functional lifting technique without pain during job related duties    Status On-going      PT LONG TERM GOAL #4   Title Pt wll be ind with advanced HEP    Status On-going      PT LONG TERM GOAL #5   Title Pt will report 50% less gas leakage due to improved abdominal pressure management    Status On-going                 Plan - 08/07/20 1007    Clinical Impression Statement Pt had some tension in Rt levators and coccygeus.  Pt did well with hip hinge and pelvic tilts, but lacks lumbar mobility and awareness at times and still needs some cues.  Pt had anterior pelvic rotation corrected with MET today and demonstrates improved pelvic stability after that.  Pt will benefit from skilled PT to continue to work on posture and core muscle activation.    PT Treatment/Interventions ADLs/Self Care Home Management;Biofeedback;Cryotherapy;Electrical Stimulation;Moist Heat;Therapeutic activities;Therapeutic exercise;Neuromuscular re-education;Patient/family education;Manual techniques;Passive range of motion;Dry needling;Taping    PT Next Visit Plan check pelvic obliquity; pelvic tilt and progress core strength and lifting technique    PT Home Exercise Plan Access Code: [email or web address]    Consulted and Agree with Plan of Care Patient           Patient will benefit from skilled therapeutic intervention in order to improve the following deficits and impairments:  Abnormal gait,Decreased activity tolerance,Decreased coordination,Impaired flexibility,Postural dysfunction,Decreased range of motion,Increased muscle spasms,Pain,Increased fascial restricitons,Decreased strength  Visit Diagnosis: Muscle weakness (generalized)  Muscle spasm of back  Abnormal posture     Problem List Patient Active Problem List   Diagnosis Date Noted  . Abnormal cervical Papanicolaou smear 10/13/2019  . Pain in pelvis 10/13/2019  . Left sided abdominal pain 10/13/2019  . 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 Incarcerated incisional hernia 08/03/2019  . Diastasis recti 08/03/2019  . S/P cesarean section 10/08/2016  . Status post tubal ligation at time of delivery, current hospitalization 10/08/2016  . History of cesarean section, low transverse 10/07/2016  . GERD 03/07/2008  . ABDOMINAL PAIN 03/02/2008  . ABDOMINAL XRAY, ABNORMAL 03/02/2008    Junious Silk, PT 08/07/2020, 12:31 PM  [facility] [address], [address]  [place], [place], [address] Phone: [phone]   Fax:  [phone]  Name: [name] MRN: [id number] Date of Birth: 12-05-1985

## 2020-08-07 NOTE — Patient Instructions (Signed)
Access Code: [email or web address]

## 2020-08-14 ENCOUNTER — Ambulatory Visit: Payer: Medicaid Other | Admitting: Physical Therapy

## 2020-08-14 ENCOUNTER — Encounter: Payer: Self-pay | Admitting: Physical Therapy

## 2020-08-14 ENCOUNTER — Other Ambulatory Visit: Payer: Self-pay

## 2020-08-14 DIAGNOSIS — M62830 Muscle spasm of back: Secondary | ICD-10-CM

## 2020-08-14 DIAGNOSIS — M6281 Muscle weakness (generalized): Secondary | ICD-10-CM | POA: Diagnosis not present

## 2020-08-14 DIAGNOSIS — R293 Abnormal posture: Secondary | ICD-10-CM

## 2020-08-14 NOTE — Therapy (Signed)
[facility] [address] [place], [place], [address] Phone: [phone]   Fax:  [phone]  Physical Therapy Treatment  Patient Details  Name: [name] MRN: [id number] Date of Birth: 07-17-1985 Referring Provider (PT): Durward Fortes, [place]   Encounter Date: 08/14/2020   PT End of Session - 08/14/20 0956    Visit Number 5    Date for PT Re-Evaluation 08/30/20    Authorization Type [employer]    PT Start Time 0948   arrived late   PT Stop Time 1013    PT Time Calculation (min) 25 min    Activity Tolerance Patient tolerated treatment well    Behavior During Therapy [facility] for tasks assessed/performed           Past Medical History:  Diagnosis Date  . Dysrhythmia 2020   Flutter in chest  . Family history of adverse reaction to anesthesia   . Gestational diabetes   . History of cesarean section, low transverse 10/07/2016  . Opioid abuse (HCC)    on Suboxone  . PONV (postoperative nausea and vomiting)   . S/P cesarean section 10/08/2016  . Status post tubal ligation at time of delivery, current hospitalization 10/08/2016  . Vaginal Pap smear, abnormal     Past Surgical History:  Procedure Laterality Date  . CESAREAN SECTION    . CESAREAN SECTION WITH BILATERAL TUBAL LIGATION Bilateral 10/08/2016   Procedure: REPEAT CESAREAN SECTION WITH BILATERAL TUBAL LIGATION;  Surgeon: Sherian Rein, MD;  [facility];  Service: Obstetrics;  Laterality: Bilateral;  Heather K to RNFA  . CHOLECYSTECTOMY    . LEEP    . LEEP    . LEEP    . VENTRAL HERNIA REPAIR N/A 10/13/2019   Procedure: LAPAROSCOPIC REPAIR OF INCISIONAL INCARCERATED ABDOMINAL WALL HERNIA WITH MESH, TAP BLOCK;  Surgeon: Karie Soda, MD;  Location: [facility];  Service: General;  Laterality: N/A;    There were no vitals filed for this visit.   Subjective Assessment - 08/14/20 0957    Subjective I was sore in the hamstrings  after doing the exercises last time, I was so sore for 4 days    Patient Stated Goals work without pain, be able to exercise and feel core is stronger    Currently in Pain? No/denies                             [facility] Adult PT Treatment/Exercise - 08/14/20 0001      Neuro Re-ed    Neuro Re-ed Details  correct ant rotation Rt side - only 1 attempt and was corrected      Lumbar Exercises: Stretches   Active Hamstring Stretch Right;Left;2 reps;30 seconds    Gastroc Stretch Left;Right;2 reps;30 seconds      Lumbar Exercises: Standing   Other Standing Lumbar Exercises hip hinge reviewed - cues to activate glutes    Other Standing Lumbar Exercises single leg with band pulses yellow, wall slides with band blue; side step blue loop - 10x each                    PT Short Term Goals - 07/31/20 0946      PT SHORT TERM GOAL #1   Title pt will report 25% less pain    Baseline 50% less    Status Achieved      PT SHORT TERM GOAL #2  Title pt will be ind with initial HEP    Status Achieved             PT Long Term Goals - 08/07/20 1107      PT LONG TERM GOAL #1   Title Pt will report 75% less pain during long shift at work    Status On-going      PT LONG TERM GOAL #2   Title Pt will be able to correctly engage the abdominal wall for ability to correctly squat for functional job related duties without pain    Status On-going      PT LONG TERM GOAL #3   Title Pt will be able to perform hip hinge correctly for functional lifting technique without pain during job related duties    Status On-going      PT LONG TERM GOAL #4   Title Pt wll be ind with advanced HEP    Status On-going      PT LONG TERM GOAL #5   Title Pt will report 50% less gas leakage due to improved abdominal pressure management    Status On-going                 Plan - 08/14/20 1015    Clinical Impression Statement Pt with shorter session today.  Pt arrived late.  Pt did  well with review of hip hinge and some progression of glute strength.  Pt will benefit from skilled PT to address strength deficits for improved finction.  She had less pelvic rotation.    PT Treatment/Interventions ADLs/Self Care Home Management;Biofeedback;Cryotherapy;Electrical Stimulation;Moist Heat;Therapeutic activities;Therapeutic exercise;Neuromuscular re-education;Patient/family education;Manual techniques;Passive range of motion;Dry needling;Taping    PT Next Visit Plan check pelvic obliquity; cont glute med and core strength    PT Home Exercise Plan Access Code: [email or web address]    Consulted and Agree with Plan of Care Patient           Patient will benefit from skilled therapeutic intervention in order to improve the following deficits and impairments:  Abnormal gait,Decreased activity tolerance,Decreased coordination,Impaired flexibility,Postural dysfunction,Decreased range of motion,Increased muscle spasms,Pain,Increased fascial restricitons,Decreased strength  Visit Diagnosis: Muscle weakness (generalized)  Muscle spasm of back  Abnormal posture     Problem List Patient Active Problem List   Diagnosis Date Noted  . Abnormal cervical Papanicolaou smear 10/13/2019  . Pain in pelvis 10/13/2019  . Left sided abdominal pain 10/13/2019  . 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 Incarcerated incisional hernia 08/03/2019  . Diastasis recti 08/03/2019  . S/P cesarean section 10/08/2016  . Status post tubal ligation at time of delivery, current hospitalization 10/08/2016  . History of cesarean section, low transverse 10/07/2016  . GERD 03/07/2008  . ABDOMINAL PAIN 03/02/2008  . ABDOMINAL XRAY, ABNORMAL 03/02/2008    Junious Silk, PT 08/14/2020, 10:21 AM  [facility] [address] [place], [place], [address] Phone: [phone]   Fax:  [phone]  Name: [name] MRN: [id number] Date  of Birth: 06-05-1985

## 2020-08-21 ENCOUNTER — Encounter: Payer: Self-pay | Admitting: Physical Therapy

## 2020-08-21 ENCOUNTER — Ambulatory Visit: Payer: Medicaid Other | Admitting: Physical Therapy

## 2020-08-21 ENCOUNTER — Other Ambulatory Visit: Payer: Self-pay

## 2020-08-21 DIAGNOSIS — M6281 Muscle weakness (generalized): Secondary | ICD-10-CM | POA: Diagnosis not present

## 2020-08-21 DIAGNOSIS — R293 Abnormal posture: Secondary | ICD-10-CM

## 2020-08-21 DIAGNOSIS — M62830 Muscle spasm of back: Secondary | ICD-10-CM

## 2020-08-21 NOTE — Therapy (Signed)
[facility] [address] [place], [place], [address] Phone: [phone]   Fax:  [phone]  Physical Therapy Treatment  Patient Details  Name: [name] MRN: [id number] Date of Birth: August 01, 1985 Referring Provider (PT): Durward Fortes, [place]   Encounter Date: 08/21/2020   PT End of Session - 08/21/20 1018    Visit Number 6    Date for PT Re-Evaluation 08/30/20    Authorization Type [employer]    PT Start Time 0935    PT Stop Time 1013    PT Time Calculation (min) 38 min    Activity Tolerance Patient tolerated treatment well    Behavior During Therapy [facility] for tasks assessed/performed           Past Medical History:  Diagnosis Date  . Dysrhythmia 2020   Flutter in chest  . Family history of adverse reaction to anesthesia   . Gestational diabetes   . History of cesarean section, low transverse 10/07/2016  . Opioid abuse (HCC)    on Suboxone  . PONV (postoperative nausea and vomiting)   . S/P cesarean section 10/08/2016  . Status post tubal ligation at time of delivery, current hospitalization 10/08/2016  . Vaginal Pap smear, abnormal     Past Surgical History:  Procedure Laterality Date  . CESAREAN SECTION    . CESAREAN SECTION WITH BILATERAL TUBAL LIGATION Bilateral 10/08/2016   Procedure: REPEAT CESAREAN SECTION WITH BILATERAL TUBAL LIGATION;  Surgeon: Sherian Rein, MD;  [facility];  Service: Obstetrics;  Laterality: Bilateral;  Heather K to RNFA  . CHOLECYSTECTOMY    . LEEP    . LEEP    . LEEP    . VENTRAL HERNIA REPAIR N/A 10/13/2019   Procedure: LAPAROSCOPIC REPAIR OF INCISIONAL INCARCERATED ABDOMINAL WALL HERNIA WITH MESH, TAP BLOCK;  Surgeon: Karie Soda, MD;  [facility];  Service: General;  Laterality: N/A;    There were no vitals filed for this visit.   Subjective Assessment - 08/21/20 0943    Subjective Pt states she had a massage friday.  Today  states she is starting period and back is hurting.    Patient Stated Goals work without pain, be able to exercise and feel core is stronger    Currently in Pain? No/denies                             [facility] Adult PT Treatment/Exercise - 08/21/20 0001      Self-Care   Self-Care Other Self-Care Comments    Other Self-Care Comments  edu on dry needling and may do this next visit      Neuro Re-ed    Neuro Re-ed Details  sitting and standing posutre with lumbar lengthened; pelvic posterior tilt      Lumbar Exercises: Sidelying   Clam Right;Left;20 reps   blue loop   Hip Abduction Right;Left;10 reps   3 sets - toe up, side , down     Manual Therapy   Manual Therapy Soft tissue mobilization    Soft tissue mobilization lumbar and thoracic paraspinals            Trigger Point Dry Needling - 08/21/20 0001    Education Handout Provided Yes                  PT Short Term Goals - 07/31/20 0946      PT SHORT TERM GOAL #1  Title pt will report 25% less pain    Baseline 50% less    Status Achieved      PT SHORT TERM GOAL #2   Title pt will be ind with initial HEP    Status Achieved             PT Long Term Goals - 08/07/20 1107      PT LONG TERM GOAL #1   Title Pt will report 75% less pain during long shift at work    Status On-going      PT LONG TERM GOAL #2   Title Pt will be able to correctly engage the abdominal wall for ability to correctly squat for functional job related duties without pain    Status On-going      PT LONG TERM GOAL #3   Title Pt will be able to perform hip hinge correctly for functional lifting technique without pain during job related duties    Status On-going      PT LONG TERM GOAL #4   Title Pt wll be ind with advanced HEP    Status On-going      PT LONG TERM GOAL #5   Title Pt will report 50% less gas leakage due to improved abdominal pressure management    Status On-going                 Plan -  08/21/20 1033    Clinical Impression Statement Pt toleraed treatment well.  She demonstrates glute weakness Lt>Rt during gait.  Pt maintained pelvis in neutral position.  Pt did well with clamshell and challenged needing cues to keep pelvis from rocking back.  Pt will benefit from skilled PT to continue working on core and glute strength for improved functional activities.    PT Treatment/Interventions ADLs/Self Care Home Management;Biofeedback;Cryotherapy;Electrical Stimulation;Moist Heat;Therapeutic activities;Therapeutic exercise;Neuromuscular re-education;Patient/family education;Manual techniques;Passive range of motion;Dry needling;Taping    PT Next Visit Plan check pelvic obliquity; cont glute med and core strength,lumbar dry needling possibly?    PT Home Exercise Plan Access Code: [email or web address]    Consulted and Agree with Plan of Care Patient           Patient will benefit from skilled therapeutic intervention in order to improve the following deficits and impairments:  Abnormal gait,Decreased activity tolerance,Decreased coordination,Impaired flexibility,Postural dysfunction,Decreased range of motion,Increased muscle spasms,Pain,Increased fascial restricitons,Decreased strength  Visit Diagnosis: Muscle weakness (generalized)  Muscle spasm of back  Abnormal posture     Problem List Patient Active Problem List   Diagnosis Date Noted  . Abnormal cervical Papanicolaou smear 10/13/2019  . Pain in pelvis 10/13/2019  . Left sided abdominal pain 10/13/2019  . 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 Incarcerated incisional hernia 08/03/2019  . Diastasis recti 08/03/2019  . S/P cesarean section 10/08/2016  . Status post tubal ligation at time of delivery, current hospitalization 10/08/2016  . History of cesarean section, low transverse 10/07/2016  . GERD 03/07/2008  . ABDOMINAL PAIN 03/02/2008  . ABDOMINAL XRAY, ABNORMAL 03/02/2008    Junious Silk,  PT 08/21/2020, 10:56 AM  [facility] [address] [place], [place], [address] Phone: [phone]   Fax:  [phone]  Name: Angelik Walls MRN: [id number] Date of Birth: 1986-04-02

## 2020-08-28 ENCOUNTER — Ambulatory Visit: Payer: Commercial Managed Care - PPO | Admitting: Physical Therapy

## 2020-09-04 ENCOUNTER — Other Ambulatory Visit: Payer: Self-pay

## 2020-09-04 ENCOUNTER — Ambulatory Visit: Payer: Medicaid Other | Attending: Physician Assistant | Admitting: Physical Therapy

## 2020-09-04 DIAGNOSIS — M62830 Muscle spasm of back: Secondary | ICD-10-CM | POA: Diagnosis present

## 2020-09-04 DIAGNOSIS — M6281 Muscle weakness (generalized): Secondary | ICD-10-CM | POA: Insufficient documentation

## 2020-09-04 DIAGNOSIS — R293 Abnormal posture: Secondary | ICD-10-CM | POA: Diagnosis present

## 2020-09-04 NOTE — Therapy (Signed)
[facility] [address] [place], [place], [address] Phone: [phone]   Fax:  [phone]  Physical Therapy Treatment  Patient Details  Name: [name] MRN: [id number] Date of Birth: 04/20/1986 Referring Provider (PT): Milas Gain, [place]   Encounter Date: 09/04/2020   PT End of Session - 09/04/20 0958    Visit Number 7    Date for PT Re-Evaluation 09/04/20    Authorization Type [employer] blue secondary    PT Start Time 0940   late   PT Stop Time 1015    PT Time Calculation (min) 35 min    Activity Tolerance Patient tolerated treatment well    Behavior During Therapy [facility] for tasks assessed/performed           Past Medical History:  Diagnosis Date  . Dysrhythmia 2020   Flutter in chest  . Family history of adverse reaction to anesthesia   . Gestational diabetes   . History of cesarean section, low transverse 10/07/2016  . Opioid abuse ([place])    on Suboxone  . PONV (postoperative nausea and vomiting)   . S/P cesarean section 10/08/2016  . Status post tubal ligation at time of delivery, current hospitalization 10/08/2016  . Vaginal Pap smear, abnormal     Past Surgical History:  Procedure Laterality Date  . CESAREAN SECTION    . CESAREAN SECTION WITH BILATERAL TUBAL LIGATION Bilateral 10/08/2016   Procedure: REPEAT CESAREAN SECTION WITH BILATERAL TUBAL LIGATION;  Surgeon: Janyth Contes, MD;  Location: [place];  Service: Obstetrics;  Laterality: Bilateral;  Heather K to RNFA  . CHOLECYSTECTOMY    . LEEP    . LEEP    . LEEP    . VENTRAL HERNIA REPAIR N/A 10/13/2019   Procedure: LAPAROSCOPIC REPAIR OF INCISIONAL INCARCERATED ABDOMINAL WALL HERNIA WITH MESH, TAP BLOCK;  Surgeon: Michael Boston, MD;  [facility];  Service: General;  Laterality: N/A;    There were no vitals filed for this visit.   Subjective Assessment - 09/04/20 0943    Subjective Pt started a detox for bloating.  Pt  states muscles ar enot sore anymore with exercises and able to do exercises at home.    Pertinent History 2 c-sections; umbilical hernia repair    Limitations House hold activities    Patient Stated Goals work without pain, be able to exercise and feel core is stronger    Currently in Pain? No/denies                             [facility] Adult PT Treatment/Exercise - 09/04/20 0001      Lumbar Exercises: Standing   Other Standing Lumbar Exercises hip hinge reviewed - added 10lb to singlle leg - cues to activate glutes and core      Lumbar Exercises: Supine   Other Supine Lumbar Exercises 90-90 hip and knee - single leg with UE movements      Lumbar Exercises: Sidelying   Other Sidelying Lumbar Exercises hip abduction 3 ways      Manual Therapy   Myofascial Release abdominal fascial release bilat                    PT Short Term Goals - 07/31/20 0946      PT SHORT TERM GOAL #1   Title pt will report 25% less pain    Baseline 50% less    Status Achieved  PT SHORT TERM GOAL #2   Title pt will be ind with initial HEP    Status Achieved             PT Long Term Goals - 09/04/20 0944      PT LONG TERM GOAL #1   Title Pt will report 75% less pain during long shift at work    Baseline 60% less    Status Partially Met      PT LONG TERM GOAL #2   Title Pt will be able to correctly engage the abdominal wall for ability to correctly squat for functional job related duties without pain    Baseline I can tell the core is engaged more    Status Achieved      PT LONG TERM GOAL #3   Title Pt will be able to perform hip hinge correctly for functional lifting technique without pain during job related duties    Status Achieved      PT LONG TERM GOAL #4   Title Pt wll be ind with advanced HEP    Status Achieved      PT LONG TERM GOAL #5   Title Pt will report 50% less gas leakage due to improved abdominal pressure management    Baseline 70% less and  haven't had leakage    Status Achieved                 Plan - 09/04/20 1034    Clinical Impression Statement Pt met most goals.  She is ind with advanced HEP for continued core strength regarding functional and job related duties.  Pt will be discharge with HEP today    PT Treatment/Interventions ADLs/Self Care Home Management;Biofeedback;Cryotherapy;Electrical Stimulation;Moist Heat;Therapeutic activities;Therapeutic exercise;Neuromuscular re-education;Patient/family education;Manual techniques;Passive range of motion;Dry needling;Taping    PT Next Visit Plan d/c today    PT Home Exercise Plan Access Code: [email or web address]    Consulted and Agree with Plan of Care Patient           Patient will benefit from skilled therapeutic intervention in order to improve the following deficits and impairments:  Abnormal gait,Decreased activity tolerance,Decreased coordination,Impaired flexibility,Postural dysfunction,Decreased range of motion,Increased muscle spasms,Pain,Increased fascial restricitons,Decreased strength  Visit Diagnosis: Muscle weakness (generalized)  Muscle spasm of back  Abnormal posture     Problem List Patient Active Problem List   Diagnosis Date Noted  . Abnormal cervical Papanicolaou smear 10/13/2019  . Pain in pelvis 10/13/2019  . Left sided abdominal pain 10/13/2019  . 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 Incarcerated incisional hernia 08/03/2019  . Diastasis recti 08/03/2019  . S/P cesarean section 10/08/2016  . Status post tubal ligation at time of delivery, current hospitalization 10/08/2016  . History of cesarean section, low transverse 10/07/2016  . GERD 03/07/2008  . ABDOMINAL PAIN 03/02/2008  . ABDOMINAL XRAY, ABNORMAL 03/02/2008    Jule Ser, PT 09/04/2020, 10:39 AM  [facility] [address] [place], [place], [address] Phone: [phone]   Fax:   [phone]  Name: [name] MRN: [id number] Date of Birth: 09-23-1985  PHYSICAL THERAPY DISCHARGE SUMMARY  Visits from Start of Care: 7  Current functional level related to goals / functional outcomes: See above goals   Remaining deficits: See above details   Education / Equipment: HEP  Plan: Patient agrees to discharge.  Patient goals were met. Patient is being discharged due to meeting the stated rehab goals.  ?????    [employer]  Aleecia Tapia, PT 09/04/20 10:43 AM

## 2020-09-11 ENCOUNTER — Encounter: Payer: Commercial Managed Care - PPO | Admitting: Physical Therapy

## 2021-02-20 IMAGING — CT CT ABD-PELV W/ CM
2 of 4 series · 15 of 46 positions shown, 17 images · IV contrast (omnipaque)
Comparison: CT abdomen pelvis dated 12/20/2018.

CLINICAL DATA: 33-year-old female with abdominal pain.

EXAM:
CT ABDOMEN AND PELVIS WITH CONTRAST
TECHNIQUE: Multidetector CT imaging of the abdomen and pelvis was performed
using the standard protocol following bolus administration of
intravenous contrast.
CONTRAST:  100mL OMNIPAQUE IOHEXOL 300 MG/ML  SOLN

[Series 2: axial st · axial · 0.68mm/px · z∈[+1143,+1493]mm · 12 of 82 slices shown, 14 images]
[im 6/82  soft-tissue]
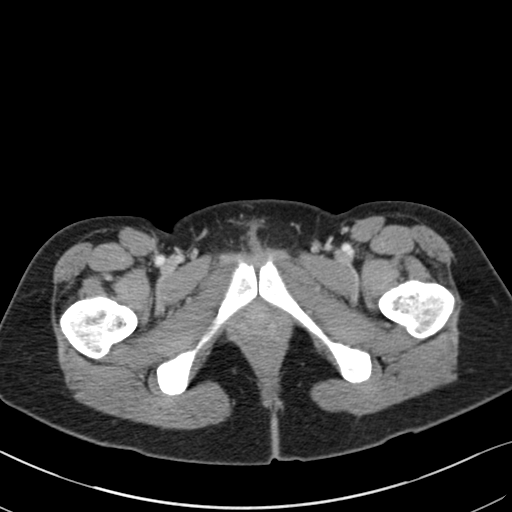
[im 6/82  bone]
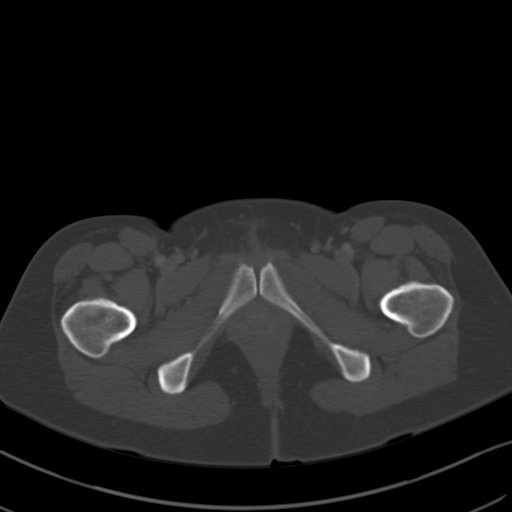
[im 11/82  soft-tissue]
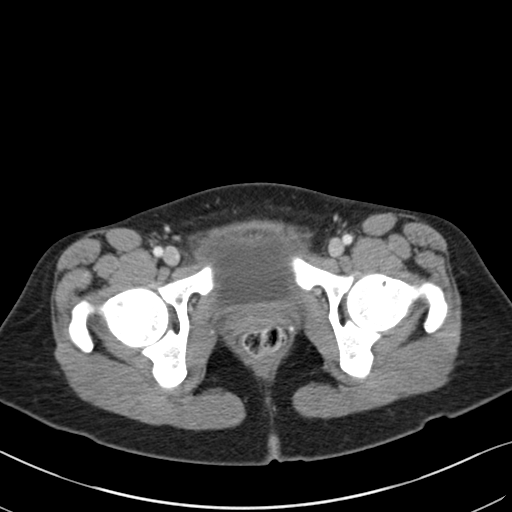
[im 21/82  soft-tissue]
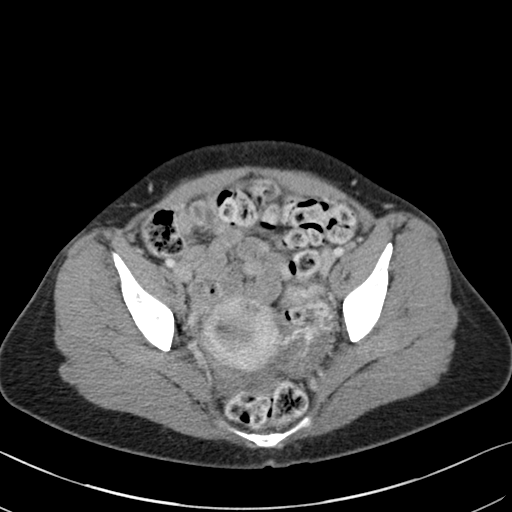
[im 26/82  soft-tissue]
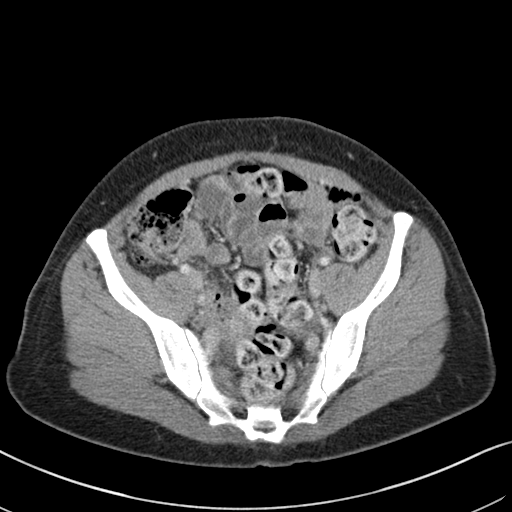
[im 31/82  soft-tissue]
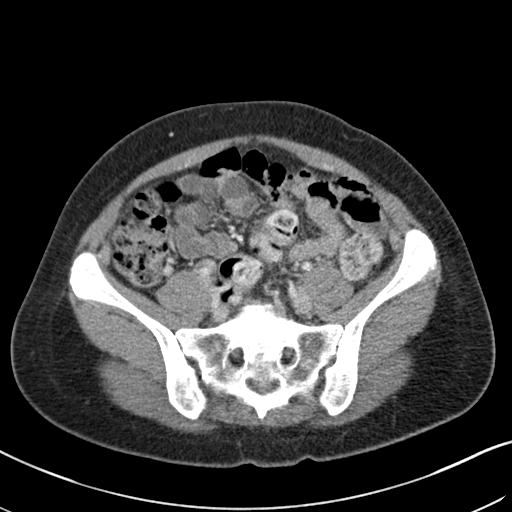
[im 36/82  soft-tissue]
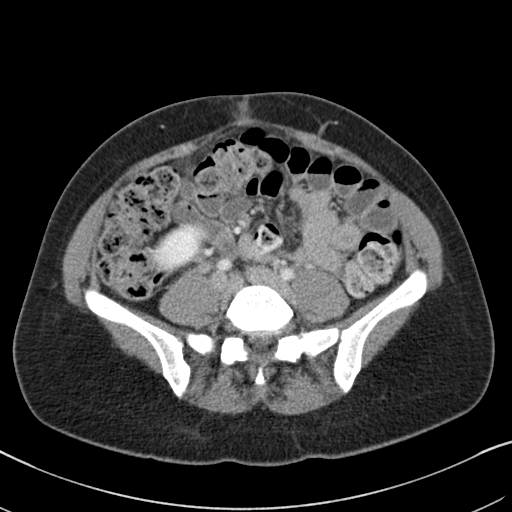
[im 46/82  soft-tissue]
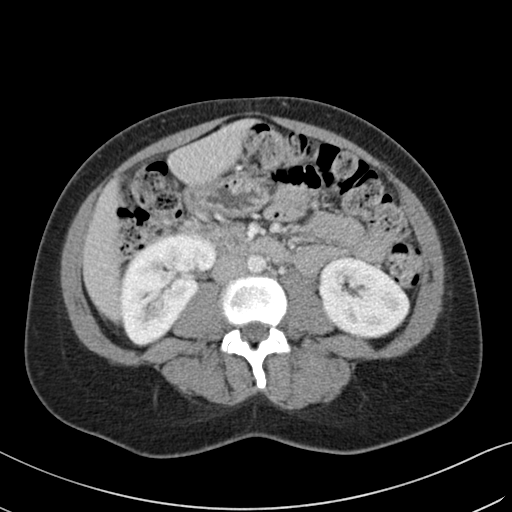
[im 51/82  soft-tissue]
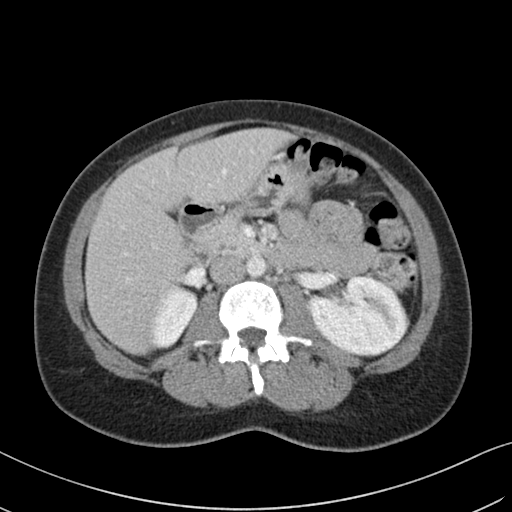
[im 56/82  soft-tissue]
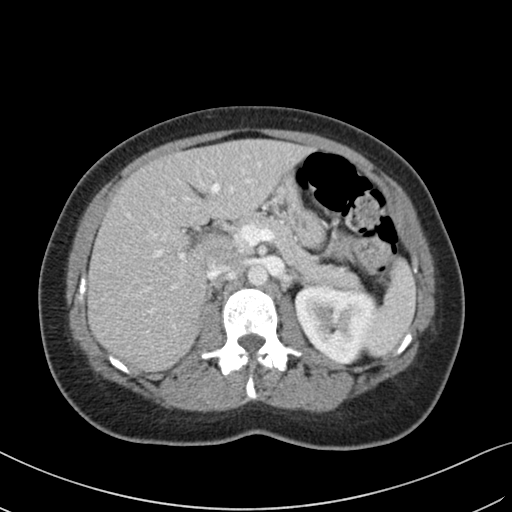
[im 56/82  bone]
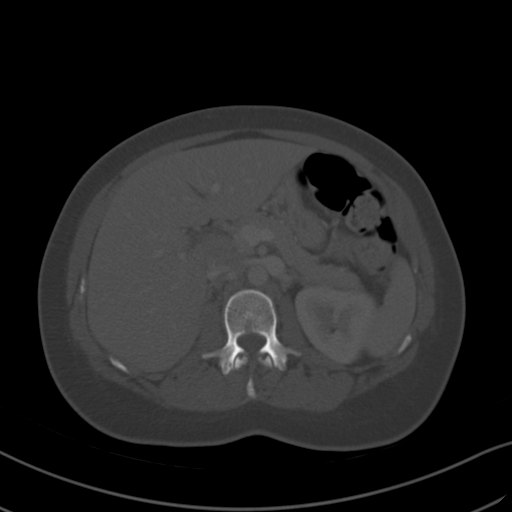
[im 61/82  soft-tissue]
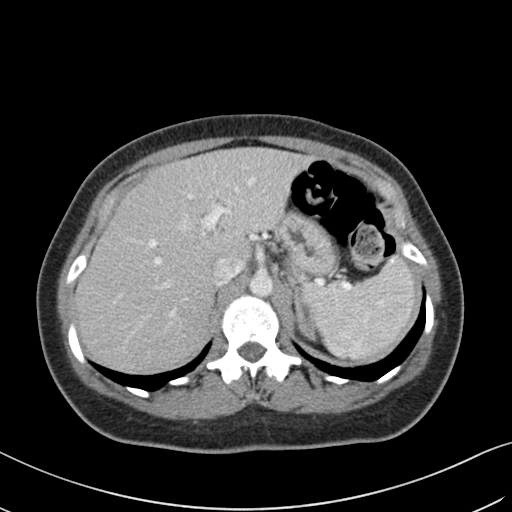
[im 71/82  soft-tissue]
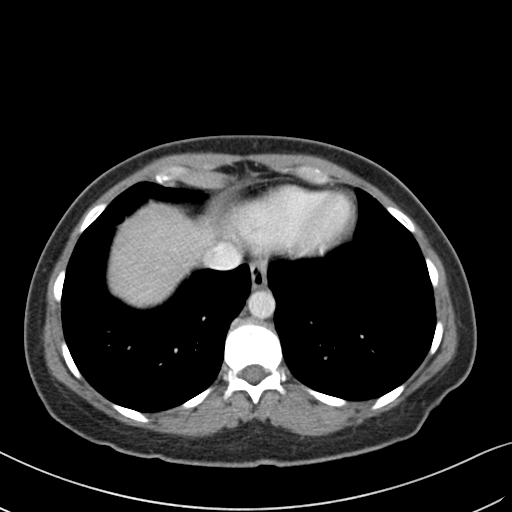
[im 76/82  soft-tissue]
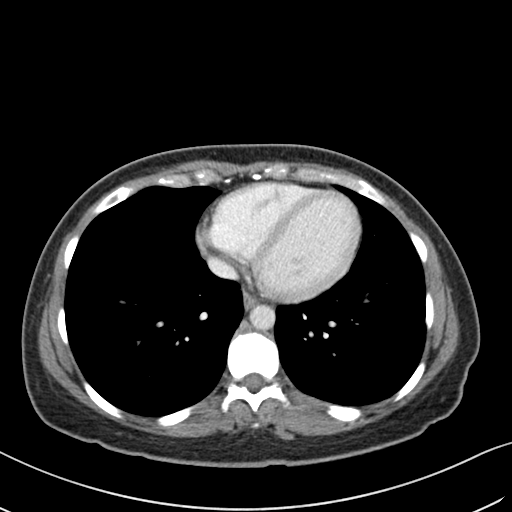

[Series 5: coronal st · coronal · 0.57mm/px · 3 of 128 slices shown]
[im 43/128  soft-tissue]
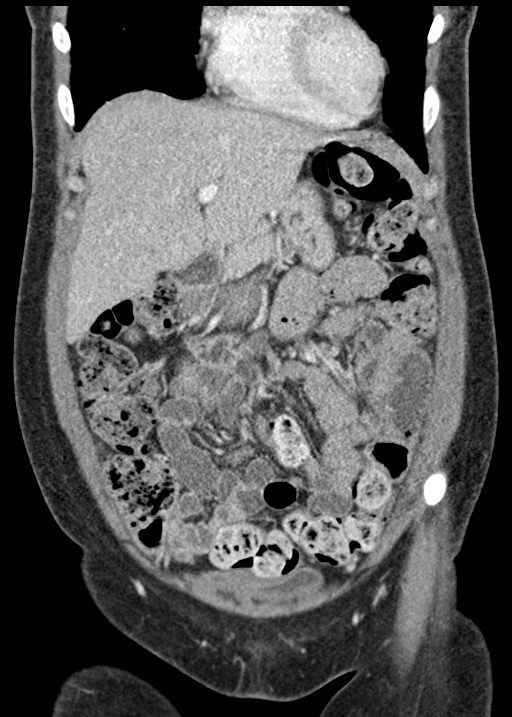
[im 57/128  soft-tissue]
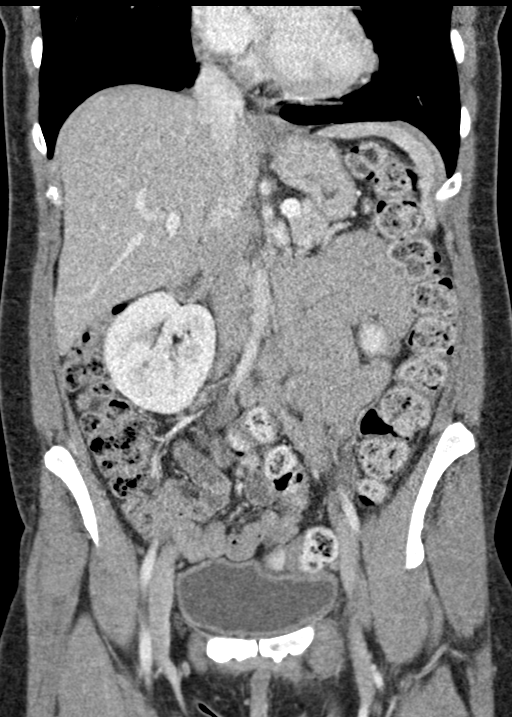
[im 71/128  soft-tissue]
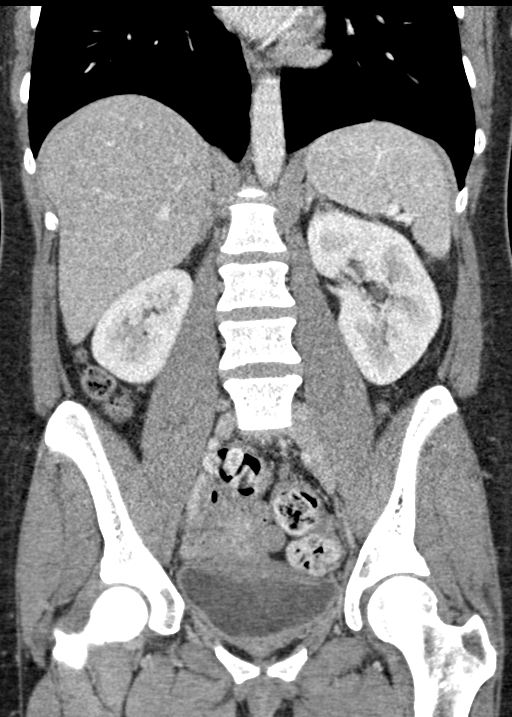

[15 of 46 positions shown; findings below may reference images not displayed]

FINDINGS: Lower chest: Several small bilateral pulmonary nodules measure up to
5 mm in the left lower lobe (series 4, image 45) similar to prior
CT. The visualized lung bases are otherwise clear.

No intra-abdominal free air. Probable small free fluid in the
pelvis.

Hepatobiliary: The liver is unremarkable. No intrahepatic biliary
ductal dilatation. Cholecystectomy. No retained calcified stone
noted in the central CBD.

Pancreas: Unremarkable. No pancreatic ductal dilatation or
surrounding inflammatory changes.

Spleen: Normal in size without focal abnormality.

Adrenals/Urinary Tract: The adrenal glands are unremarkable. The
kidneys, visualized ureters, and urinary bladder appear
unremarkable.

Stomach/Bowel: There is moderate amount of stool throughout the
colon. No bowel obstruction or active inflammation. The appendix is
normal.

Vascular/Lymphatic: Minimal atherosclerotic calcification of the
abdominal aorta. The aorta and IVC are otherwise unremarkable. No
portal venous gas. There is no adenopathy.

Reproductive: The uterus is retroverted or retroflexed. There is a
2.2 cm left ovarian corpus luteum.

Other: Small fat containing umbilical hernia. No fluid collection.
Minimal haziness of the herniated fat. Correlation with clinical
exam is recommended to exclude a degree of incarceration.

Musculoskeletal: No acute or significant osseous findings.
IMPRESSION: 1. Moderate colonic stool burden. No bowel obstruction or active
inflammation. Normal appendix.
2. A 2.2 cm left ovarian corpus luteum.
3. Small fat containing umbilical hernia.

## 2022-05-01 ENCOUNTER — Encounter (HOSPITAL_BASED_OUTPATIENT_CLINIC_OR_DEPARTMENT_OTHER): Payer: Self-pay | Admitting: Obstetrics and Gynecology

## 2022-05-01 NOTE — Progress Notes (Signed)
Spoke w/ via phone for pre-op interview--- pt Lab needs dos---- cbc, cmp, urine preg              Lab results------ no COVID test -----patient states asymptomatic no test needed Arrive at ------- 0730 on 05-05-2022 NPO after MN NO Solid Food.  Clear liquids from MN until--- 0630 Med rec completed Medications to take morning of surgery ----- none Diabetic medication ----- n/a Patient instructed no nail polish to be worn day of surgery Patient instructed to bring photo id and insurance card day of surgery Patient aware to have Driver (ride ) / caregiver  for 24 hours after surgery --husband, mark Patient Special Instructions ----- pt stated she was given instructions by Dr Ellyn Hack do not take suboxone night before and morning of surgery. Pre-Op special Istructions ----- n/a Patient verbalized understanding of instructions that were given at this phone interview. Patient denies shortness of breath, chest pain, fever, cough at this phone interview.

## 2022-05-04 NOTE — H&P (Signed)
[name] is an 36 y.o. female with irr VB, likel polyp on [place].  D/W pt r/b/a of surgery, also process and expectations.    Pertinent Gynecological History: [id number] SABx 2 LTCS x 2 + ABN pap, s/p LEEP, last WNL, HR HPV neg 2019 No STI BTL for contra  Menstrual History:  Patient's last menstrual period was 04/20/2022 (exact date).    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)    on Suboxone therapy   PONV (postoperative nausea and vomiting)    Vitamin D deficiency    Wears glasses     Past Surgical History:  Procedure Laterality Date   CESAREAN SECTION  2008   CESAREAN SECTION WITH BILATERAL TUBAL LIGATION Bilateral 10/08/2016   Procedure: REPEAT CESAREAN SECTION WITH BILATERAL TUBAL LIGATION;  Surgeon: Sherian Rein, MD;  Location: [facility];  Service: Obstetrics;  Laterality: Bilateral;  Heather K to RNFA   LAPAROSCOPIC CHOLECYSTECTOMY  2016   VENTRAL HERNIA REPAIR N/A 10/13/2019   Procedure: LAPAROSCOPIC REPAIR OF INCISIONAL INCARCERATED ABDOMINAL WALL HERNIA WITH MESH, TAP BLOCK;  Surgeon: Karie Soda, MD;  Location: [facility];  Service: General;  Laterality: N/A;  LEEP  Family History: Diabetes   Social History:  reports that she quit smoking about 2 years ago. Her smoking use included cigarettes. She has a 2.50 pack-year smoking history. She has never used smokeless tobacco. She reports that she does not currently use drugs. She reports that she does not drink alcohol. married  Allergies:  Allergies  Allergen Reactions   Amoxicillin Hives and Itching    Has patient had a PCN reaction causing immediate rash, facial/tongue/throat swelling, SOB or lightheadedness with hypotension:  No Has patient had a PCN reaction causing severe rash involving mucus membranes or skin necrosis: Yes Has patient had a PCN reaction that required hospitalization No Has patient had a PCN reaction occurring within the last 10 years: Yes If all of the above answers are "NO", then may proceed with Cephalosporin use.    Bactrim [Sulfamethoxazole-Trimethoprim] Hives and Itching    Took both bactrim and amoxicillin at the same time unsure which caused the reaction   Macrobid [Nitrofurantoin] Nausea Only    Per pt very nausea and felt very weird/ bad    Meds: suboxone, Allegra    Review of Systems  Constitutional: Negative.   Respiratory: Negative.    Cardiovascular: Negative.   Gastrointestinal: Negative.   Genitourinary: Negative.   Musculoskeletal: Negative.   Skin: Negative.   Neurological: Negative.   Psychiatric/Behavioral: Negative.      Height 5' (1.524 m), weight 63 kg, last menstrual period 04/20/2022, not currently breastfeeding. Physical Exam Constitutional:      Appearance: Normal appearance.  HENT:     Head: Normocephalic and atraumatic.  Cardiovascular:     Rate and Rhythm: Normal rate and regular rhythm.  Pulmonary:     Effort: Pulmonary effort is normal.     Breath sounds: Normal breath sounds.  Abdominal:     General: Bowel sounds are normal.     Palpations: Abdomen is soft.  Genitourinary:    General: Normal vulva.     Rectum: Normal.  Musculoskeletal:        General: Normal range of motion.     Cervical back: Normal range of motion and neck supple.  Skin:    General: Skin is warm and dry.  Neurological:     General: No focal deficit present.     Mental Status: She is alert and oriented to person, place, and time.  Psychiatric:        Mood and Affect: Mood normal.        Behavior: Behavior normal.    [place]: likely polyp at fundus, nl uterus.  Ovaries WNL (generous), dominant follicle  Assessment/Plan: 62MB [id number] for hysteroscopy, D&C, possible  polypectomy D/w pt r/b/a of surgery,also process and expectations Will proceed  Carylon Tamburro Bovard-Stuckert 05/04/2022, 10:51 AM

## 2022-05-05 ENCOUNTER — Ambulatory Visit (HOSPITAL_BASED_OUTPATIENT_CLINIC_OR_DEPARTMENT_OTHER): Payer: Medicaid Other | Admitting: Certified Registered Nurse Anesthetist

## 2022-05-05 ENCOUNTER — Encounter (HOSPITAL_BASED_OUTPATIENT_CLINIC_OR_DEPARTMENT_OTHER): Payer: Self-pay | Admitting: Obstetrics and Gynecology

## 2022-05-05 ENCOUNTER — Encounter (HOSPITAL_BASED_OUTPATIENT_CLINIC_OR_DEPARTMENT_OTHER)
Admission: RE | Disposition: A | Discharge status: Home / Self Care | Payer: Self-pay | Source: Home / Self Care | Attending: Obstetrics and Gynecology

## 2022-05-05 ENCOUNTER — Other Ambulatory Visit: Payer: Self-pay

## 2022-05-05 ENCOUNTER — Ambulatory Visit (HOSPITAL_BASED_OUTPATIENT_CLINIC_OR_DEPARTMENT_OTHER)
Admission: RE | Admit: 2022-05-05 | Discharge: 2022-05-05 | Disposition: A | Discharge status: Home / Self Care | Payer: Medicaid Other | Attending: Obstetrics and Gynecology | Admitting: Obstetrics and Gynecology

## 2022-05-05 DIAGNOSIS — G709 Myoneural disorder, unspecified: Secondary | ICD-10-CM | POA: Insufficient documentation

## 2022-05-05 DIAGNOSIS — K219 Gastro-esophageal reflux disease without esophagitis: Secondary | ICD-10-CM | POA: Insufficient documentation

## 2022-05-05 DIAGNOSIS — N840 Polyp of corpus uteri: Secondary | ICD-10-CM | POA: Insufficient documentation

## 2022-05-05 DIAGNOSIS — Z01818 Encounter for other preprocedural examination: Secondary | ICD-10-CM

## 2022-05-05 DIAGNOSIS — Z87891 Personal history of nicotine dependence: Secondary | ICD-10-CM | POA: Diagnosis not present

## 2022-05-05 DIAGNOSIS — N939 Abnormal uterine and vaginal bleeding, unspecified: Secondary | ICD-10-CM | POA: Diagnosis present

## 2022-05-05 HISTORY — DX: Vitamin D deficiency, unspecified: E55.9

## 2022-05-05 HISTORY — DX: Personal history of gestational diabetes: Z86.32

## 2022-05-05 HISTORY — DX: Presence of spectacles and contact lenses: Z97.3

## 2022-05-05 HISTORY — DX: Personal history of cervical dysplasia: Z87.410

## 2022-05-05 HISTORY — DX: Opioid dependence, in remission: F11.21

## 2022-05-05 HISTORY — DX: Hypomagnesemia: E83.42

## 2022-05-05 HISTORY — PX: DILATATION & CURETTAGE/HYSTEROSCOPY WITH MYOSURE: SHX6511

## 2022-05-05 LAB — COMPREHENSIVE METABOLIC PANEL
ALT: 24 U/L (ref 0–44)
AST: 26 U/L (ref 15–41)
Albumin: 4.4 g/dL (ref 3.5–5.0)
Alkaline Phosphatase: 49 U/L (ref 38–126)
Anion gap: 8 (ref 5–15)
BUN: 9 mg/dL (ref 6–20)
CO2: 23 mmol/L (ref 22–32)
Calcium: 9.2 mg/dL (ref 8.9–10.3)
Chloride: 108 mmol/L (ref 98–111)
Creatinine, Ser: 0.58 mg/dL (ref 0.44–1.00)
GFR, Estimated: >60 mL/min (ref >60)
Glucose, Bld: 87 mg/dL (ref 70–99)
Potassium: 3.7 mmol/L (ref 3.5–5.1)
Sodium: 139 mmol/L (ref 135–145)
Total Bilirubin: 0.4 mg/dL (ref 0.3–1.2)
Total Protein: 7.7 g/dL (ref 6.5–8.1)

## 2022-05-05 LAB — CBC
HCT: 40.3 % (ref 36.0–46.0)
Hemoglobin: 13 g/dL (ref 12.0–15.0)
MCH: 29.4 pg (ref 26.0–34.0)
MCHC: 32.3 g/dL (ref 30.0–36.0)
MCV: 91.2 fL (ref 80.0–100.0)
Platelets: 235 10*3/uL (ref 150–400)
RBC: 4.42 MIL/uL (ref 3.87–5.11)
RDW: 12.7 % (ref 11.5–15.5)
WBC: 6.4 10*3/uL (ref 4.0–10.5)
nRBC: 0 % (ref 0.0–0.2)

## 2022-05-05 LAB — POCT PREGNANCY, URINE: Preg Test, Ur: NEGATIVE

## 2022-05-05 SURGERY — DILATATION & CURETTAGE/HYSTEROSCOPY WITH MYOSURE
Anesthesia: General

## 2022-05-05 MED ORDER — LACTATED RINGERS IV SOLN
INTRAVENOUS | Status: DC
  Administered 2022-05-05: 1000 mL via INTRAVENOUS

## 2022-05-05 MED ORDER — CEFAZOLIN SODIUM-DEXTROSE 2-4 GM/100ML-% IV SOLN
INTRAVENOUS | Status: AC
  Filled 2022-05-05: qty 100

## 2022-05-05 MED ORDER — HYDROMORPHONE HCL 1 MG/ML IJ SOLN
INTRAMUSCULAR | Status: AC
  Filled 2022-05-05: qty 1

## 2022-05-05 MED ORDER — DEXMEDETOMIDINE HCL IN NACL 80 MCG/20ML IV SOLN
INTRAVENOUS | Status: AC
  Filled 2022-05-05: qty 20

## 2022-05-05 MED ORDER — IBUPROFEN 600 MG PO TABS: 600.0000 mg | ORAL_TABLET | Freq: Four times a day (QID) | ORAL | 1 refills | Status: AC | PRN

## 2022-05-05 MED ORDER — SODIUM CHLORIDE 0.9 % IR SOLN
Status: DC | PRN
  Administered 2022-05-05: 3000 mL

## 2022-05-05 MED ORDER — EPHEDRINE SULFATE-NACL 50-0.9 MG/10ML-% IV SOSY
PREFILLED_SYRINGE | INTRAVENOUS | Status: DC | PRN
  Administered 2022-05-05: 5 mg via INTRAVENOUS

## 2022-05-05 MED ORDER — MIDAZOLAM HCL 2 MG/2ML IJ SOLN
INTRAMUSCULAR | Status: DC | PRN
  Administered 2022-05-05 (×2): 1 mg via INTRAVENOUS

## 2022-05-05 MED ORDER — ROCURONIUM BROMIDE 10 MG/ML (PF) SYRINGE
PREFILLED_SYRINGE | INTRAVENOUS | Status: AC
  Filled 2022-05-05: qty 10

## 2022-05-05 MED ORDER — LIDOCAINE HCL (PF) 2 % IJ SOLN
INTRAMUSCULAR | Status: AC
  Filled 2022-05-05: qty 10

## 2022-05-05 MED ORDER — FENTANYL CITRATE (PF) 100 MCG/2ML IJ SOLN
INTRAMUSCULAR | Status: AC
  Filled 2022-05-05: qty 2

## 2022-05-05 MED ORDER — DEXAMETHASONE SODIUM PHOSPHATE 10 MG/ML IJ SOLN
INTRAMUSCULAR | Status: AC
  Filled 2022-05-05: qty 2

## 2022-05-05 MED ORDER — LIDOCAINE HCL 1 % IJ SOLN
INTRAMUSCULAR | Status: AC
  Filled 2022-05-05: qty 20

## 2022-05-05 MED ORDER — KETOROLAC TROMETHAMINE 30 MG/ML IJ SOLN
INTRAMUSCULAR | Status: AC
  Filled 2022-05-05: qty 2

## 2022-05-05 MED ORDER — FENTANYL CITRATE (PF) 250 MCG/5ML IJ SOLN
INTRAMUSCULAR | Status: DC | PRN
  Administered 2022-05-05: 25 ug via INTRAVENOUS
  Administered 2022-05-05: 75 ug via INTRAVENOUS

## 2022-05-05 MED ORDER — SUCCINYLCHOLINE CHLORIDE 200 MG/10ML IV SOSY
PREFILLED_SYRINGE | INTRAVENOUS | Status: AC
  Filled 2022-05-05: qty 10

## 2022-05-05 MED ORDER — KETOROLAC TROMETHAMINE 30 MG/ML IJ SOLN
INTRAMUSCULAR | Status: DC | PRN
  Administered 2022-05-05: 30 mg via INTRAVENOUS

## 2022-05-05 MED ORDER — PROPOFOL 10 MG/ML IV BOLUS
INTRAVENOUS | Status: DC | PRN
  Administered 2022-05-05: 150 mg via INTRAVENOUS

## 2022-05-05 MED ORDER — CEFAZOLIN SODIUM-DEXTROSE 2-4 GM/100ML-% IV SOLN
2.0000 g | INTRAVENOUS | Status: AC
  Administered 2022-05-05: 2 g via INTRAVENOUS

## 2022-05-05 MED ORDER — LACTATED RINGERS IV SOLN: INTRAVENOUS | Status: DC

## 2022-05-05 MED ORDER — POVIDONE-IODINE 10 % EX SWAB: 2.0000 | Freq: Once | CUTANEOUS | Status: DC

## 2022-05-05 MED ORDER — HYDROMORPHONE HCL 1 MG/ML IJ SOLN
0.2500 mg | INTRAMUSCULAR | Status: DC | PRN
  Administered 2022-05-05: 0.25 mg via INTRAVENOUS

## 2022-05-05 MED ORDER — PROPOFOL 10 MG/ML IV BOLUS
INTRAVENOUS | Status: AC
  Filled 2022-05-05: qty 20

## 2022-05-05 MED ORDER — ONDANSETRON HCL 4 MG/2ML IJ SOLN
INTRAMUSCULAR | Status: DC | PRN
  Administered 2022-05-05: 4 mg via INTRAVENOUS

## 2022-05-05 MED ORDER — MIDAZOLAM HCL 2 MG/2ML IJ SOLN
INTRAMUSCULAR | Status: AC
  Filled 2022-05-05: qty 2

## 2022-05-05 MED ORDER — ONDANSETRON HCL 4 MG/2ML IJ SOLN
INTRAMUSCULAR | Status: AC
  Filled 2022-05-05: qty 4

## 2022-05-05 MED ORDER — LIDOCAINE 2% (20 MG/ML) 5 ML SYRINGE
INTRAMUSCULAR | Status: DC | PRN
  Administered 2022-05-05: 60 mg via INTRAVENOUS

## 2022-05-05 MED ORDER — PHENYLEPHRINE 80 MCG/ML (10ML) SYRINGE FOR IV PUSH (FOR BLOOD PRESSURE SUPPORT)
PREFILLED_SYRINGE | INTRAVENOUS | Status: DC | PRN
  Administered 2022-05-05: 80 ug via INTRAVENOUS

## 2022-05-05 MED ORDER — DEXAMETHASONE SODIUM PHOSPHATE 10 MG/ML IJ SOLN
INTRAMUSCULAR | Status: DC | PRN
  Administered 2022-05-05: 10 mg via INTRAVENOUS

## 2022-05-05 MED ORDER — LIDOCAINE HCL 1 % IJ SOLN
INTRAMUSCULAR | Status: DC | PRN
  Administered 2022-05-05: 10 mL

## 2022-05-05 SURGICAL SUPPLY — 16 items
CATH ROBINSON RED A/P 16FR (CATHETERS) ×1 IMPLANT
DEVICE MYOSURE LITE (MISCELLANEOUS) IMPLANT
DEVICE MYOSURE REACH (MISCELLANEOUS) IMPLANT
DILATOR CANAL MILEX (MISCELLANEOUS) IMPLANT
DRSG TELFA 3X8 NADH STRL (GAUZE/BANDAGES/DRESSINGS) ×1 IMPLANT
GAUZE 4X4 16PLY ~~LOC~~+RFID DBL (SPONGE) ×2 IMPLANT
GLOVE BIO SURGEON STRL SZ 6.5 (GLOVE) ×1 IMPLANT
GLOVE SURG SS PI 7.0 STRL IVOR (GLOVE) IMPLANT
GOWN STRL REUS W/TWL LRG LVL3 (GOWN DISPOSABLE) ×1 IMPLANT
IV NS IRRIG 3000ML ARTHROMATIC (IV SOLUTION) ×1 IMPLANT
KIT PROCEDURE FLUENT (KITS) ×1 IMPLANT
KIT TURNOVER CYSTO (KITS) ×1 IMPLANT
PACK VAGINAL MINOR WOMEN LF (CUSTOM PROCEDURE TRAY) ×1 IMPLANT
PAD OB MATERNITY 4.3X12.25 (PERSONAL CARE ITEMS) ×1 IMPLANT
SEAL CERVICAL OMNI LOK (ABLATOR) IMPLANT
SEAL ROD LENS SCOPE MYOSURE (ABLATOR) ×1 IMPLANT

## 2022-05-05 NOTE — Anesthesia Postprocedure Evaluation (Signed)
Anesthesia Post Note  Patient: [name]  Procedure(s) Performed: DILATATION & CURETTAGE/HYSTEROSCOPY WITH POSSIBLE MYOSURE     Patient location during evaluation: PACU Anesthesia Type: General Level of consciousness: awake Pain management: pain level controlled Vital Signs Assessment: post-procedure vital signs reviewed and stable Respiratory status: spontaneous breathing Cardiovascular status: stable Postop Assessment: no apparent nausea or vomiting Anesthetic complications: no   No notable events documented.  Last Vitals:  Vitals:   05/05/22 1100 05/05/22 1147  BP: 102/61 92/61  Pulse: (!) 59 66  Resp: 16 16  Temp:  37.2 C  SpO2: 99% 99%    Last Pain:  Vitals:   05/05/22 1147  TempSrc:   PainSc: 3                  Sereniti Wan

## 2022-05-05 NOTE — Anesthesia Preprocedure Evaluation (Signed)
Anesthesia Evaluation  Patient identified by MRN, date of birth, ID band Patient awake  General Assessment Comment:History noted Dr. Chilton Si  Reviewed: Allergy & Precautions, NPO status , Patient's Chart, lab work & pertinent test results  History of Anesthesia Complications (+) PONV and history of anesthetic complications  Airway Mallampati: II       Dental   Pulmonary former smoker   breath sounds clear to auscultation       Cardiovascular negative cardio ROS  Rhythm:Regular Rate:Normal     Neuro/Psych  Neuromuscular disease    GI/Hepatic Neg liver ROS,GERD  ,,  Endo/Other  negative endocrine ROS    Renal/GU negative Renal ROS     Musculoskeletal   Abdominal   Peds  Hematology   Anesthesia Other Findings   Reproductive/Obstetrics                             Anesthesia Physical Anesthesia Plan  ASA: 2  Anesthesia Plan: General   Post-op Pain Management:    Induction: Intravenous  PONV Risk Score and Plan: 4 or greater and Ondansetron, Dexamethasone, Midazolam and Treatment may vary due to age or medical condition  Airway Management Planned: LMA  Additional Equipment:   Intra-op Plan:   Post-operative Plan: Extubation in OR  Informed Consent:      Dental advisory given  Plan Discussed with: CRNA and Anesthesiologist  Anesthesia Plan Comments:        Anesthesia Quick Evaluation

## 2022-05-05 NOTE — Brief Op Note (Signed)
05/05/2022  10:07 AM  PATIENT:  [name]  36 y.o. female  PRE-OPERATIVE DIAGNOSIS:  polyp(s)  POST-OPERATIVE DIAGNOSIS:  polyp(s)  PROCEDURE:  Procedure(s): DILATATION & CURETTAGE/HYSTEROSCOPY WITH POSSIBLE MYOSURE (N/A)  SURGEON:  Surgeon(s) and Role:    * Bovard-Stuckert, Kenrick Pore, MD - Primary  ANESTHESIA:   local and general by LMA  EBL:  15 mL IVF by anesthesia, voided immediately before case  DRAINS: none   LOCAL MEDICATIONS USED:  LIDOCAINE  and Amount: 10 ml  SPECIMEN:  Source of Specimen:  uterine polyp/endometrial curettings  DISPOSITION OF SPECIMEN:  PATHOLOGY  COUNTS:  YES  TOURNIQUET:  * No tourniquets in log *  DICTATION: .Other Dictation: Dictation Number [id number]  PLAN OF CARE: Discharge to home after PACU  PATIENT DISPOSITION:  PACU - hemodynamically stable.   Delay start of Pharmacological VTE agent (>24hrs) due to surgical blood loss or risk of bleeding: no

## 2022-05-05 NOTE — Interval H&P Note (Signed)
History and Physical Interval Note:  05/05/2022 9:02 AM  [name]  has presented today for surgery, with the diagnosis of polyp.  The various methods of treatment have been discussed with the patient and family. After consideration of risks, benefits and other options for treatment, the patient has consented to  Procedure(s): DILATATION & CURETTAGE/HYSTEROSCOPY WITH POSSIBLE MYOSURE (N/A) as a surgical intervention.  The patient's history has been reviewed, patient examined, no change in status, stable for surgery.  I have reviewed the patient's chart and labs.  Questions were answered to the patient's satisfaction.     Dorla Guizar Bovard-Stuckert

## 2022-05-05 NOTE — Discharge Instructions (Signed)
     No ibuprofen, Advil, Aleve, Motrin, ketorolac, meloxicam, naproxen, or other NSAIDS until after 4:00 pm today if needed.     Post Anesthesia Home Care Instructions  Activity: Get plenty of rest for the remainder of the day. A responsible individual must stay with you for 24 hours following the procedure.  For the next 24 hours, DO NOT: -Drive a car -Operate machinery -Drink alcoholic beverages -Take any medication unless instructed by your physician -Make any legal decisions or sign important papers.  Meals: Start with liquid foods such as gelatin or soup. Progress to regular foods as tolerated. Avoid greasy, spicy, heavy foods. If nausea and/or vomiting occur, drink only clear liquids until the nausea and/or vomiting subsides. Call your physician if vomiting continues.  Special Instructions/Symptoms: Your throat may feel dry or sore from the anesthesia or the breathing tube placed in your throat during surgery. If this causes discomfort, gargle with warm salt water. The discomfort should disappear within 24 hours.  

## 2022-05-05 NOTE — Anesthesia Procedure Notes (Signed)
Procedure Name: LMA Insertion Date/Time: 05/05/2022 9:35 AM  Performed by: Dairl Ponder, CRNAPre-anesthesia Checklist: Patient identified, Emergency Drugs available, Suction available and Patient being monitored Patient Re-evaluated:Patient Re-evaluated prior to induction Oxygen Delivery Method: Circle System Utilized Preoxygenation: Pre-oxygenation with 100% oxygen Induction Type: IV induction Ventilation: Mask ventilation without difficulty LMA: LMA inserted LMA Size: 4.0 Number of attempts: 1 Airway Equipment and Method: Bite block Placement Confirmation: positive ETCO2 Tube secured with: Tape Dental Injury: Teeth and Oropharynx as per pre-operative assessment

## 2022-05-05 NOTE — Transfer of Care (Signed)
Immediate Anesthesia Transfer of Care Note  Patient: [name]  Procedure(s) Performed: DILATATION & CURETTAGE/HYSTEROSCOPY WITH POSSIBLE MYOSURE  Patient Location: PACU  Anesthesia Type:General  Level of Consciousness: sedated  Airway & Oxygen Therapy: Patient Spontanous Breathing  Post-op Assessment: Report given to RN and Post -op Vital signs reviewed and stable  Post vital signs: Reviewed and stable  Last Vitals:  Vitals Value Taken Time  BP 130/76 05/05/22 1007  Temp    Pulse 62 05/05/22 1009  Resp 17 05/05/22 1009  SpO2 99 % 05/05/22 1009  Vitals shown include unvalidated device data.  Last Pain:  Vitals:   05/05/22 0807  TempSrc: Oral  PainSc: 0-No pain      Patients Stated Pain Goal: 6 (05/05/22 0807)  Complications: No notable events documented.

## 2022-05-05 NOTE — Op Note (Unsigned)
[name], [name] MEDICAL RECORD NO: [id number] ACCOUNT NO: [id number] DATE OF BIRTH: 04-17-1986 FACILITY: [facility] LOCATION: [facility] PHYSICIAN: Sherian Rein, MD  Operative Report   DATE OF PROCEDURE: 05/05/2022  PREOPERATIVE DIAGNOSIS: Intrauterine polyp.  POSTOPERATIVE DIAGNOSIS:  Intrauterine polyp.  PROCEDURE:  Hysteroscopy, D and C with MyoSure.  SURGEON: Sherian Rein, MD  ASSISTANT:  None.  ANESTHESIA:  Local and general by LMA.  ESTIMATED BLOOD LOSS:  Approximately 15 mL.  IV FLUID: By anesthesia.  Voided immediately before the case.  COMPLICATIONS:  None.  PATHOLOGY: Uterine polyp and endometrial curettings to pathology.  DESCRIPTION OF PROCEDURE:  After informed consent was reviewed, the patient including risks, benefits and alternatives of the surgical procedure she was transported to the operating room and placed on the table in supine position.  General anesthesia by  LMA was induced and found to be adequate.  She was then placed in the [place] stirrups, prepped and draped in the normal sterile fashion.  After an appropriate timeout was performed using an open-sided speculum her cervix was easily visualized and  injected with 10 mL of an endocervical block and the anterior lip of the cervix was grasped with a single tooth tenaculum.  The cervical os was dilated to accommodate the hysteroscope to 17 [place].  A brief survey of the uterus revealed bilateral normal  ostia and numerous polyps on the patient's left side by her ostia.  MyoSure device was used to remove these polyps.  After the case the uterus appeared normal, easy visualization of bilateral ostia.  The patient tolerated the procedure well.  Sponge, lap  and needle counts were correct x2.  The patient was then awaken in stable condition, taken to the PACU.   PUS D: 05/05/2022 10:14:07 am T: 05/05/2022 11:10:00 am  JOB: [id number]/ [id number]

## 2022-05-06 LAB — SURGICAL PATHOLOGY

## 2022-05-08 ENCOUNTER — Encounter (HOSPITAL_BASED_OUTPATIENT_CLINIC_OR_DEPARTMENT_OTHER): Payer: Self-pay | Admitting: Obstetrics and Gynecology

## 2022-08-18 ENCOUNTER — Ambulatory Visit: Payer: Commercial Managed Care - HMO | Admitting: Podiatry

## 2022-08-18 ENCOUNTER — Ambulatory Visit (INDEPENDENT_AMBULATORY_CARE_PROVIDER_SITE_OTHER): Payer: Commercial Managed Care - HMO

## 2022-08-18 DIAGNOSIS — M19071 Primary osteoarthritis, right ankle and foot: Secondary | ICD-10-CM | POA: Diagnosis not present

## 2022-08-18 DIAGNOSIS — M778 Other enthesopathies, not elsewhere classified: Secondary | ICD-10-CM

## 2022-08-18 MED ORDER — METHYLPREDNISOLONE 4 MG PO TBPK: ORAL_TABLET | ORAL | 0 refills | Status: AC

## 2022-08-18 MED ORDER — MELOXICAM 15 MG PO TABS: 15.0000 mg | ORAL_TABLET | Freq: Every day | ORAL | 0 refills | Status: AC

## 2022-08-18 NOTE — Progress Notes (Signed)
Subjective:  Patient ID: [name], female    DOB: 1986-03-06,  MRN: [id number]  Chief Complaint  Patient presents with   Foot Pain    np//The top of my right foot has been hurting for months. [facility] did an xray but couldn't see anything. They put me in a boot but i have yet to get relief. I work on my feet and desperately need it fixed.    37 y.o. female presents with concern for pain in the top of the right foot.  She has previously gone to [facility] who did x-rays said they could not see anything.  They put her in a cam boot.  She says her foot does feel better in the cam boot but does not go away when she stops wearing the boot she has continued pain.  She has not taken any anti-inflammatory medications other than topical Voltaren gel which does help as well.  Pain is mostly on the top of the foot and it seems to radiate towards the outside.  Does not have any numbness or tingling in the toes.  Patient does walk extended distances on hard surfaces at work as she is a Programme researcher, broadcasting/film/video.  Past Medical History:  Diagnosis Date   Family history of adverse reaction to anesthesia    per pt half-sister has bp goes high   History of cervical dysplasia    per pt LEEP  x3  last one 2017  (done gyn office)   History of gestational diabetes    Hypomagnesemia    Opioid type dependence in remission ([place])    on Suboxone therapy   PONV (postoperative nausea and vomiting)    Vitamin D deficiency    Wears glasses     Allergies  Allergen Reactions   Amoxicillin Hives and Itching    Has patient had a PCN reaction causing immediate rash, facial/tongue/throat swelling, SOB or lightheadedness with hypotension: No Has patient had a PCN reaction causing severe rash involving mucus membranes or skin necrosis: Yes Has patient had a PCN reaction that required hospitalization No Has patient had a PCN reaction occurring within the last 10 years: Yes If all of the above answers are "NO", then  may proceed with Cephalosporin use.    Bactrim [Sulfamethoxazole-Trimethoprim] Hives and Itching    Took both bactrim and amoxicillin at the same time unsure which caused the reaction   Macrobid [Nitrofurantoin] Nausea Only    Per pt very nausea and felt very weird/ bad    ROS: Negative except as per HPI above  Objective:  General: AAO x3, NAD  Dermatological: With inspection and palpation of the right and left lower extremities there are no open sores, no preulcerative lesions, no rash or signs of infection present. Nails are of normal length thickness and coloration.   Vascular:  Dorsalis Pedis artery and Posterior Tibial artery pedal pulses are 2/4 bilateral.  Capillary fill time < 3 sec to all digits.   Neruologic: Grossly intact via light touch bilateral. Protective threshold intact to all sites bilateral.   Musculoskeletal: Pain with palpation along the dorsal right midfoot at the tarsometatarsal joints.  Worst the third and fourth tarsometatarsal joint area.  Gait: Unassisted, Nonantalgic.   No images are attached to the encounter.  Radiographs:  Date: 08/18/2022 XR the right foot Weightbearing AP/Lateral/Oblique   Findings: no fracture, dislocation, swelling noted.  There is possible mild early osteoarthritic changes noted at the tarsometatarsal joints especially the lesser TMT J's 2 through  4.  This is evidenced by decreased joint space irregularity of the joint space subchondral sclerosis and narrowing. Assessment:   1. Arthritis of right midfoot   2. Capsulitis of right foot      Plan:  Patient was evaluated and treated and all questions answered.  # Arthritis of the tarsometatarsal joints of the right midfoot -Discussed with the patient that her pain does sound to be a arthritic inflammatory pain as evidenced by chronic aching deep aching pain in the right midfoot. -E Rx for methylprednisolone 4 mg steroid taper pack take as directed for 6 days -E Rx for meloxicam  15 mg take once daily in the morning as directed for 30 days discussed risk benefits of both of these medications with the patient -Continue use of Voltaren topical anti-inflammatory gel as needed for pain control -Also recommended good supportive shoes including oofos or Ortho feet and compression socks -Discussed possible need for MRI of the right foot if not improved at next visit.  Also discussed possibility of a steroid injection however patient wished to proceed with the oral steroid versus the steroid injection at this time -Okay to be weightbearing as tolerated continue to use cam boot if it decreases her pain want her to try and wean out of it however and 2 to 3 weeks  Return in about 4 weeks (around 09/15/2022) for Follow-up right midfoot arthritis.          Everitt Amber, DPM [facility]

## 2022-09-22 ENCOUNTER — Ambulatory Visit (INDEPENDENT_AMBULATORY_CARE_PROVIDER_SITE_OTHER): Payer: Commercial Managed Care - HMO | Admitting: Podiatry

## 2022-09-22 DIAGNOSIS — M19071 Primary osteoarthritis, right ankle and foot: Secondary | ICD-10-CM

## 2022-09-22 DIAGNOSIS — M84374A Stress fracture, right foot, initial encounter for fracture: Secondary | ICD-10-CM

## 2022-09-22 DIAGNOSIS — M778 Other enthesopathies, not elsewhere classified: Secondary | ICD-10-CM

## 2022-09-22 NOTE — Progress Notes (Signed)
Subjective:  Patient ID: [name], female    DOB: [birth year],  MRN: [id number]  Chief Complaint  Patient presents with   Follow-up    Arthritis of right midfoot    37 y.o. female presents for follow up of right midfoot / 4th TMTJ pain.  Patient was previously placed on methyl prednisone steroid Dosepak as well as meloxicam.  She has been walking in a cam boot.  She says the steroid initially helped a lot and she felt much better but then she is after she stopped taking the steroid she got out of the cam boot and the pain returned.  She does not have pain when she walks in the cam boot but she does have pain if she is walking barefoot.  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)    on Suboxone therapy   PONV (postoperative nausea and vomiting)    Vitamin D deficiency    Wears glasses     Allergies  Allergen Reactions   Amoxicillin Hives and Itching    Has patient had a PCN reaction causing immediate rash, facial/tongue/throat swelling, SOB or lightheadedness with hypotension: No Has patient had a PCN reaction causing severe rash involving mucus membranes or skin necrosis: Yes Has patient had a PCN reaction that required hospitalization No Has patient had a PCN reaction occurring within the last 10 years: Yes If all of the above answers are "NO", then may proceed with Cephalosporin use.    Bactrim [Sulfamethoxazole-Trimethoprim] Hives and Itching    Took both bactrim and amoxicillin at the same time unsure which caused the reaction   Macrobid [Nitrofurantoin] Nausea Only    Per pt very nausea and felt very weird/ bad    ROS: Negative except as per HPI above  Objective:  General: AAO x3,  NAD  Dermatological: With inspection and palpation of the right and left lower extremities there are no open sores, no preulcerative lesions, no rash or signs of infection present. Nails are of normal length thickness and coloration.   Vascular:  Dorsalis Pedis artery and Posterior Tibial artery pedal pulses are 2/4 bilateral.  Capillary fill time < 3 sec to all digits.   Neruologic: Grossly intact via light touch bilateral. Protective threshold intact to all sites bilateral.   Musculoskeletal: Pain with palpation along the dorsal right midfoot at the tarsometatarsal joints.  Worst the third and fourth tarsometatarsal joint area, overall unchanged from prior  Gait: Unassisted, Nonantalgic.   No images are attached to the encounter.  Radiographs:  Date: 08/18/2022 XR the right foot Weightbearing AP/Lateral/Oblique   Findings: no fracture, dislocation, swelling noted.  There is possible mild early osteoarthritic changes noted at the tarsometatarsal joints especially the lesser TMT J's 2 through 4.  This is evidenced by decreased joint space irregularity of the joint space subchondral sclerosis and narrowing. Assessment:   1. Arthritis of right midfoot   2. Capsulitis of right foot   3. Stress fracture of right foot, initial encounter       Plan:  Patient was evaluated and treated and all questions answered.  # Arthritis of the tarsometatarsal joints of the right midfoot versus stress  fracture of the right fourth metatarsal -Discussed that as the patient has not improved we will have to consider advanced imaging.  Concern for possible arthritic changes at the tarsometatarsal joints as well as stress fracture -MRI of the right foot ordered to assess for possible stress fracture versus TMT J arthritis -Continue weightbearing in cam boot for pain relief and treatment of her symptoms.  Return in about 4 weeks (around 10/20/2022) for Follow-up right foot MRI results.          Corinna Gab, DPM [facility]

## 2022-10-07 ENCOUNTER — Encounter: Payer: Self-pay | Admitting: Podiatry

## 2022-10-31 ENCOUNTER — Other Ambulatory Visit: Payer: Commercial Managed Care - HMO
